# Patient Record
Sex: Female | Born: 1955
Health system: Southern US, Community
[De-identification: ages and names within clinical notes are randomized; demographics above are authoritative.]

## PROBLEM LIST (undated history)

## (undated) DIAGNOSIS — E7800 Pure hypercholesterolemia, unspecified: Secondary | ICD-10-CM

## (undated) DIAGNOSIS — K30 Functional dyspepsia: Secondary | ICD-10-CM

## (undated) DIAGNOSIS — N809 Endometriosis, unspecified: Secondary | ICD-10-CM

## (undated) DIAGNOSIS — Z87442 Personal history of urinary calculi: Secondary | ICD-10-CM

## (undated) DIAGNOSIS — J309 Allergic rhinitis, unspecified: Secondary | ICD-10-CM

## (undated) DIAGNOSIS — E042 Nontoxic multinodular goiter: Secondary | ICD-10-CM

## (undated) DIAGNOSIS — I10 Essential (primary) hypertension: Secondary | ICD-10-CM

## (undated) DIAGNOSIS — N201 Calculus of ureter: Secondary | ICD-10-CM

## (undated) DIAGNOSIS — M65332 Trigger finger, left middle finger: Secondary | ICD-10-CM

## (undated) DIAGNOSIS — F419 Anxiety disorder, unspecified: Secondary | ICD-10-CM

## (undated) DIAGNOSIS — R002 Palpitations: Secondary | ICD-10-CM

## (undated) DIAGNOSIS — M722 Plantar fascial fibromatosis: Secondary | ICD-10-CM

## (undated) DIAGNOSIS — Z973 Presence of spectacles and contact lenses: Secondary | ICD-10-CM

## (undated) DIAGNOSIS — C4491 Basal cell carcinoma of skin, unspecified: Secondary | ICD-10-CM

## (undated) DIAGNOSIS — E041 Nontoxic single thyroid nodule: Secondary | ICD-10-CM

## (undated) DIAGNOSIS — M1990 Unspecified osteoarthritis, unspecified site: Secondary | ICD-10-CM

## (undated) HISTORY — PX: BREAST SURGERY: SHX581

## (undated) HISTORY — PX: COLONOSCOPY: SHX174

## (undated) HISTORY — PX: EYE SURGERY: SHX253

## (undated) HISTORY — PX: CHOLECYSTECTOMY: SHX55

## (undated) HISTORY — PX: DIAGNOSTIC LAPAROSCOPY: SUR761

---

## 1999-01-04 ENCOUNTER — Other Ambulatory Visit: Admission: RE | Admit: 1999-01-04 | Discharge: 1999-01-04 | Payer: Self-pay | Admitting: *Deleted

## 2000-05-21 ENCOUNTER — Other Ambulatory Visit: Admission: RE | Admit: 2000-05-21 | Discharge: 2000-05-21 | Payer: Self-pay | Admitting: Obstetrics & Gynecology

## 2001-06-09 ENCOUNTER — Other Ambulatory Visit: Admission: RE | Admit: 2001-06-09 | Discharge: 2001-06-09 | Payer: Self-pay | Admitting: Obstetrics & Gynecology

## 2001-10-08 ENCOUNTER — Other Ambulatory Visit: Admission: RE | Admit: 2001-10-08 | Discharge: 2001-10-08 | Payer: Self-pay | Admitting: Obstetrics & Gynecology

## 2002-07-14 ENCOUNTER — Other Ambulatory Visit: Admission: RE | Admit: 2002-07-14 | Discharge: 2002-07-14 | Payer: Self-pay | Admitting: Obstetrics & Gynecology

## 2003-05-05 ENCOUNTER — Encounter (INDEPENDENT_AMBULATORY_CARE_PROVIDER_SITE_OTHER): Payer: Self-pay | Admitting: Specialist

## 2003-05-05 ENCOUNTER — Observation Stay (HOSPITAL_COMMUNITY): Admission: RE | Admit: 2003-05-05 | Discharge: 2003-05-06 | Payer: Self-pay | Admitting: General Surgery

## 2003-08-29 ENCOUNTER — Other Ambulatory Visit: Admission: RE | Admit: 2003-08-29 | Discharge: 2003-08-29 | Payer: Self-pay

## 2004-09-17 ENCOUNTER — Other Ambulatory Visit: Admission: RE | Admit: 2004-09-17 | Discharge: 2004-09-17 | Payer: Self-pay | Admitting: Family Medicine

## 2005-09-24 ENCOUNTER — Other Ambulatory Visit: Admission: RE | Admit: 2005-09-24 | Discharge: 2005-09-24 | Payer: Self-pay | Admitting: Family Medicine

## 2005-10-30 ENCOUNTER — Encounter: Admission: RE | Admit: 2005-10-30 | Discharge: 2005-10-30 | Payer: Self-pay | Admitting: Family Medicine

## 2005-12-25 ENCOUNTER — Encounter: Admission: RE | Admit: 2005-12-25 | Discharge: 2005-12-25 | Payer: Self-pay | Admitting: Family Medicine

## 2006-01-11 ENCOUNTER — Encounter: Admission: RE | Admit: 2006-01-11 | Discharge: 2006-01-11 | Payer: Self-pay | Admitting: Family Medicine

## 2007-04-02 ENCOUNTER — Encounter: Admission: RE | Admit: 2007-04-02 | Discharge: 2007-04-02 | Payer: Self-pay | Admitting: Family Medicine

## 2007-04-15 ENCOUNTER — Encounter: Admission: RE | Admit: 2007-04-15 | Discharge: 2007-04-15 | Payer: Self-pay | Admitting: Family Medicine

## 2007-04-22 ENCOUNTER — Encounter: Admission: RE | Admit: 2007-04-22 | Discharge: 2007-04-22 | Payer: Self-pay | Admitting: Family Medicine

## 2008-11-08 ENCOUNTER — Other Ambulatory Visit: Admission: RE | Admit: 2008-11-08 | Discharge: 2008-11-08 | Payer: Self-pay | Admitting: Family Medicine

## 2010-05-09 ENCOUNTER — Encounter: Admission: RE | Admit: 2010-05-09 | Discharge: 2010-05-09 | Payer: Self-pay | Admitting: Family Medicine

## 2011-03-08 NOTE — Op Note (Signed)
NAMEANYELY, CUNNING                           ACCOUNT NO.:  [id number]   MEDICAL RECORD NO.:  [id number]                   PATIENT TYPE:  AMB   LOCATION:  DAY                                  FACILITY:  [facility]   PHYSICIAN:  Timothy E. Earlene Plater, M.D.              DATE OF BIRTH:  1956-01-19   DATE OF PROCEDURE:  05/05/2003  DATE OF DISCHARGE:                                 OPERATIVE REPORT   PREOPERATIVE DIAGNOSIS:  Chronic cholecystolithiasis.   POSTOPERATIVE DIAGNOSIS:  Chronic cholecystolithiasis.   OPERATION/PROCEDURE:  Laparoscopic cholecystectomy.   SURGEON:  Timothy E. Earlene Plater, M.D.   ASSISTANT:  Currie Paris, M.D.   ANESTHESIA:  General.   INDICATIONS:  Mrs. Diffee presents with symptomatic chronic  cholecystolithiasis, normal liver function studies and otherwise good past  history.  She is identified and the permit signed and evaluated by  anesthesia.   DESCRIPTION OF PROCEDURE:  She is taken to the operating room and placed  supine. General endotracheal anesthesia administered.  Her abdomen is  prepped and draped in the usual fashion.  Marcaine 0.25% with epinephrine  was used prior to each incision.  Horizontal infraumbilical incision made  through an old scar, fascia identified and opened vertically, peritoneum  entered without complications.  A #1 Vicryl placed and then the Hasson  catheter threaded through the suture and tied in placed.  The abdomen was  insufflated.  General peritoneoscopy was unremarkable except for some filmy  adhesions of the cecum to the lateral abdominal wall.  The gallbladder  appeared chronically inflamed.  A 10 mm trocar was placed in the mid  epigastrium.  Two 5 mm trocars in the mid epigastrium.  The gallbladder was  grasped, placed on tension.  Its overall anatomy was good showing a partial  mesentery, a long cystic duct and a visible cystic artery.  A huge window  was made posterior behind the gallbladder involving half of the  body of the  gallbladder.  The cystic duct was dissected out free.  The cystic artery  dissected out free and we then quadruply clipped each duct of the vessel,  divided those and the gallbladder was removed from the gallbladder bed  without incident or complication.  The gallbladder bed was dry.  The  gallbladder was removed through the infundibulum incision which was closed  under direct vision.  Irrigation carried out and  was completely clear of all irrigant and CO2.  Trocars were removed under  direct vision.  Counts correct.  Skin incisions inspected and then closed  with subcuticular sutures of 3-0 Monocryl.  Steri-Strips applied.  Final  counts correct.  She tolerated it well, was awakened and taken to the  recovery room in good condition.  Timothy E. Earlene Plater, M.D.    TED/MEDQ  D:  05/05/2003  T:  05/05/2003  Job:  [id number]   cc:   Talmadge Coventry, M.D.  [address], [address]  [place]  [place] [address]  Fax: [phone]

## 2011-11-21 ENCOUNTER — Other Ambulatory Visit (HOSPITAL_COMMUNITY)
Admission: RE | Admit: 2011-11-21 | Discharge: 2011-11-21 | Disposition: A | Discharge status: Home / Self Care | Payer: BC Managed Care – PPO | Source: Ambulatory Visit | Attending: Family Medicine | Admitting: Family Medicine

## 2011-11-21 DIAGNOSIS — Z0000 Encounter for general adult medical examination without abnormal findings: Secondary | ICD-10-CM | POA: Insufficient documentation

## 2011-11-22 ENCOUNTER — Other Ambulatory Visit: Payer: Self-pay | Admitting: Family Medicine

## 2012-05-04 ENCOUNTER — Other Ambulatory Visit: Payer: Self-pay | Admitting: Family Medicine

## 2012-05-04 DIAGNOSIS — E049 Nontoxic goiter, unspecified: Secondary | ICD-10-CM

## 2012-05-07 ENCOUNTER — Ambulatory Visit
Admission: RE | Admit: 2012-05-07 | Discharge: 2012-05-07 | Disposition: A | Discharge status: Home / Self Care | Payer: BC Managed Care – PPO | Source: Ambulatory Visit | Attending: Family Medicine | Admitting: Family Medicine

## 2012-05-07 DIAGNOSIS — E049 Nontoxic goiter, unspecified: Secondary | ICD-10-CM

## 2014-02-03 ENCOUNTER — Other Ambulatory Visit (HOSPITAL_COMMUNITY)
Admission: RE | Admit: 2014-02-03 | Discharge: 2014-02-03 | Disposition: A | Discharge status: Home / Self Care | Payer: BC Managed Care – PPO | Source: Ambulatory Visit | Attending: Family Medicine | Admitting: Family Medicine

## 2014-02-03 ENCOUNTER — Other Ambulatory Visit: Payer: Self-pay | Admitting: Family Medicine

## 2014-02-03 DIAGNOSIS — Z0000 Encounter for general adult medical examination without abnormal findings: Secondary | ICD-10-CM | POA: Insufficient documentation

## 2016-05-02 ENCOUNTER — Other Ambulatory Visit (HOSPITAL_COMMUNITY)
Admission: RE | Admit: 2016-05-02 | Discharge: 2016-05-02 | Disposition: A | Discharge status: Home / Self Care | Payer: BLUE CROSS/BLUE SHIELD | Source: Ambulatory Visit | Attending: Family Medicine | Admitting: Family Medicine

## 2016-05-02 ENCOUNTER — Other Ambulatory Visit: Payer: Self-pay | Admitting: Family Medicine

## 2016-05-02 DIAGNOSIS — Z124 Encounter for screening for malignant neoplasm of cervix: Secondary | ICD-10-CM | POA: Diagnosis not present

## 2016-05-02 DIAGNOSIS — E042 Nontoxic multinodular goiter: Secondary | ICD-10-CM

## 2016-05-03 LAB — CYTOLOGY - PAP

## 2016-05-09 ENCOUNTER — Ambulatory Visit
Admission: RE | Admit: 2016-05-09 | Discharge: 2016-05-09 | Disposition: A | Discharge status: Home / Self Care | Payer: BLUE CROSS/BLUE SHIELD | Source: Ambulatory Visit | Attending: Family Medicine | Admitting: Family Medicine

## 2016-05-09 DIAGNOSIS — E042 Nontoxic multinodular goiter: Secondary | ICD-10-CM

## 2018-02-17 ENCOUNTER — Other Ambulatory Visit: Payer: Self-pay | Admitting: Physician Assistant

## 2018-02-17 ENCOUNTER — Ambulatory Visit
Admission: RE | Admit: 2018-02-17 | Discharge: 2018-02-17 | Disposition: A | Discharge status: Home / Self Care | Payer: BLUE CROSS/BLUE SHIELD | Source: Ambulatory Visit | Attending: Physician Assistant | Admitting: Physician Assistant

## 2018-02-17 DIAGNOSIS — R319 Hematuria, unspecified: Secondary | ICD-10-CM

## 2018-02-17 DIAGNOSIS — R109 Unspecified abdominal pain: Secondary | ICD-10-CM

## 2018-02-17 DIAGNOSIS — R10A2 Flank pain, left side: Secondary | ICD-10-CM

## 2018-02-24 ENCOUNTER — Other Ambulatory Visit: Payer: Self-pay | Admitting: Urology

## 2018-02-24 ENCOUNTER — Encounter (HOSPITAL_BASED_OUTPATIENT_CLINIC_OR_DEPARTMENT_OTHER): Payer: Self-pay

## 2018-02-25 ENCOUNTER — Other Ambulatory Visit: Payer: Self-pay

## 2018-02-25 ENCOUNTER — Encounter (HOSPITAL_BASED_OUTPATIENT_CLINIC_OR_DEPARTMENT_OTHER): Payer: Self-pay

## 2018-02-25 NOTE — Progress Notes (Signed)
Spoke with:  "Cindy" NPO:  After Midnight, no gum, candy, or mints   Arrival time:  0630AM Labs:  Istat8, EKG AM medications:  Escitalopram, Xanax if needed, Nasal spray Pre op orders:  Yes Ride home:  Fritz Pickerel (husband) [phone]

## 2018-02-25 NOTE — Progress Notes (Signed)
Spoke with:  "Cindy" NPO:  After Midnight, no gum, candy, or mints   Arrival time:  0630AM Labs/Procedures:  KUB, EKG, Istat8 AM medications:  Escitalopram, Xanax if needed, Nasal spray Pre op orders:  Yes Ride home:  Fritz Pickerel (husband) [phone]

## 2018-02-26 NOTE — H&P (Signed)
HPI: Theresa Rice is a 62 year-old female with a left ureteral calculus.  The patient's stone was on her left side. She first noticed the symptoms 02/17/2018. This is her first kidney stone. There is not a history of calculus disease in the family. She is not currently having flank pain, back pain, groin pain, nausea, vomiting, fever or chills. She does not have a burning sensation when she urinates. She has not caught a stone in her urine strainer since her symptoms began.   She has had Ureteral Stent for treatment of her stones in the past.   02/20/18: On 02/17/18 she experienced gross hematuria. She said she has since had some slight discomfort in her left flank but is not having any pain at this time. There has been no change in her voiding pattern. She had a stone 21 years ago based on hydronephrosis on an ultrasound when she was pregnant and underwent stent placement but had to have no further surgical intervention and has not had any further difficulty with stones since that time.   A CT scan on 02/17/18 revealed what is described by the radiologist accurately as an 8 mm long left UPJ stone however its width is actually 5 mm. Small, peripherally located left renal calculi were also noted but no right renal calculi present. It has Hounsfield units of 550.     ALLERGIES: atorvastatin Naproxen Sodium TBCR    MEDICATIONS: None   GU PSH: Endometrial Ablation    NON-GU PSH: Breast lumpectomy Cholecystectomy (laparoscopic)    GU PMH: None   NON-GU PMH: None   FAMILY HISTORY: Heart Disease - Father High Cholesterol - Mother Hypertension - Father, Mother Mother deceased at age 74 from alzheimers - Mother   SOCIAL HISTORY: Marital Status: Married Preferred Language: English; Ethnicity: Not Hispanic Or Latino; Race: White Current Smoking Status: Patient has never smoked.   Tobacco Use Assessment Completed: Used Tobacco in last 30 days?    REVIEW OF SYSTEMS:    GU Review Female:    Patient denies frequent urination, hard to postpone urination, burning /pain with urination, get up at night to urinate, leakage of urine, stream starts and stops, trouble starting your stream, have to strain to urinate, and being pregnant.  Gastrointestinal (Upper):   Patient denies nausea, vomiting, and indigestion/ heartburn.  Gastrointestinal (Lower):   Patient denies diarrhea and constipation.  Constitutional:   Patient denies fever, night sweats, weight loss, and fatigue.  Skin:   Patient denies skin rash/ lesion and itching.  Eyes:   Patient denies blurred vision and double vision.  Ears/ Nose/ Throat:   Patient denies sore throat and sinus problems.  Hematologic/Lymphatic:   Patient denies swollen glands and easy bruising.  Cardiovascular:   Patient denies leg swelling and chest pains.  Respiratory:   Patient denies cough and shortness of breath.  Endocrine:   Patient denies excessive thirst.  Musculoskeletal:   Patient denies back pain and joint pain.  Neurological:   Patient denies headaches and dizziness.  Psychologic:   Patient denies depression and anxiety.   VITAL SIGNS:    Weight 180 lb / 81.65 kg  Height 64 in / 162.56 cm  BP 113/73 mmHg  Pulse 80 /min  Temperature 98.0 F / 36.6 C  BMI 30.9 kg/m   MULTI-SYSTEM PHYSICAL EXAMINATION:    Constitutional: Well-nourished. No physical deformities. Normally developed. Good grooming.  Neck: Neck symmetrical, not swollen. Normal tracheal position.  Respiratory: No labored breathing, no use of accessory muscles.  Normal breath sounds.  Cardiovascular: Normal temperature, normal extremity pulses, no swelling, no varicosities.   Lymphatic: No enlargement of neck, axillae, groin.  Skin: No paleness, no jaundice, no cyanosis. No lesion, no ulcer, no rash.  Neurologic / Psychiatric: Oriented to time, oriented to place, oriented to person. No depression, no anxiety, no agitation.  Gastrointestinal: No mass, no tenderness, no rigidity,  non obese abdomen.  Eyes: Normal conjunctivae. Normal eyelids.  Ears, Nose, Mouth, and Throat: Left ear no scars, no lesions, no masses. Right ear no scars, no lesions, no masses. Nose no scars, no lesions, no masses. Normal hearing. Normal lips.  Musculoskeletal: Normal gait and station of head and neck.     PAST DATA REVIEWED:  Source Of History:  Patient  Lab Test Review:   BUN/Creatinine, Calcium  Records Review:   Previous Doctor Records, POC Tool  X-Ray Review: C.T. Stone Protocol: Reviewed Films. Reviewed Report. Discussed With Patient.    Notes:                     Her creatinine in 8/18 was normal at 0.62. At that time she was found to have a serum calcium of 10.2.   PROCEDURES:         KUB - [id number]  A single view of the abdomen is obtained.      Independent review of her KUB reveals I cannot definitely discern the stone in her left ureter. I do see a density but it is right at the tip of the L3 transverse process on the left-hand side and may just be the transverse process itself.         Urinalysis Dipstick Dipstick Cont'd Micro  Color: Yellow Bilirubin: Neg WBC/hpf: 0 - 5/hpf  Appearance: Clear Ketones: Neg RBC/hpf: 40 - 60/hpf  Specific Gravity: 1.025 Blood: 3+ Bacteria: Rare (0-9/hpf)  pH: 6.5 Protein: 1+ Cystals: NS (Not Seen)  Glucose: Neg Urobilinogen: 0.2 Casts: NS (Not Seen)    Nitrites: Neg Trichomonas: Not Present    Leukocyte Esterase: Neg Mucous: Not Present      Epithelial Cells: NS (Not Seen)      Yeast: NS (Not Seen)      Sperm: Not Present  Notes:   We discussed the management of urinary stones. These options include observation, ureteroscopy, shockwave lithotripsy, and PCNL. We discussed which options are relevant to these particular stones. We discussed the natural history of stones as well as the complications of untreated stones and the impact on quality of life without treatment as well as with each of the above listed treatments. We also discussed  the efficacy of each treatment in its ability to clear the stone burden. With any of these management options I discussed the signs and symptoms of infection and the need for emergent treatment should these be experienced. For each option we discussed the ability of each procedure to clear the patient of their stone burden.   For observation I described the risks which include but are not limited to silent renal damage, life-threatening infection, need for emergent surgery, failure to pass stone, and pain.   For ureteroscopy I described the risks which include heart attack, stroke, pulmonary embolus, death, bleeding, infection, damage to contiguous structures, positioning injury, ureteral stricture, ureteral avulsion, ureteral injury, need for ureteral stent, inability to perform ureteroscopy, need for an interval procedure, inability to clear stone burden, stent discomfort and pain.   For shockwave lithotripsy I described the risks which include arrhythmia, kidney contusion,  kidney hemorrhage, need for transfusion, long-term risk of diabetes or hypertension, back discomfort, flank ecchymosis, flank abrasion, inability to break up stone, inability to pass stone fragments, Steinstrasse, infection associated with obstructing stones, need for different surgical procedure, need for repeat shockwave lithotripsy, and death.    ASSESSMENT/PLAN:      ICD-10 Details  1 GU:   Ureteral calculus - N20.1 Left, Acute - Since her stone is not visible on KUB lithotripsy is not an option so we discussed ureteroscopy versus medical expulsive therapy. She has pain medication and is on tamsulosin already. She would like to proceed with ureteroscopy if her stone does not pass spontaneously and therefore that will be scheduled.  2   Renal calculus - N20.0 Left, She has small left renal calculi.

## 2018-02-27 ENCOUNTER — Ambulatory Visit (HOSPITAL_BASED_OUTPATIENT_CLINIC_OR_DEPARTMENT_OTHER): Payer: BLUE CROSS/BLUE SHIELD | Admitting: Anesthesiology

## 2018-02-27 ENCOUNTER — Other Ambulatory Visit: Payer: Self-pay

## 2018-02-27 ENCOUNTER — Ambulatory Visit (HOSPITAL_COMMUNITY): Payer: BLUE CROSS/BLUE SHIELD

## 2018-02-27 ENCOUNTER — Ambulatory Visit (HOSPITAL_BASED_OUTPATIENT_CLINIC_OR_DEPARTMENT_OTHER)
Admission: RE | Admit: 2018-02-27 | Discharge: 2018-02-27 | Disposition: A | Discharge status: Home / Self Care | Payer: BLUE CROSS/BLUE SHIELD | Source: Ambulatory Visit | Attending: Urology | Admitting: Urology

## 2018-02-27 ENCOUNTER — Encounter (HOSPITAL_BASED_OUTPATIENT_CLINIC_OR_DEPARTMENT_OTHER)
Admission: RE | Disposition: A | Discharge status: Home / Self Care | Payer: Self-pay | Source: Ambulatory Visit | Attending: Urology

## 2018-02-27 ENCOUNTER — Encounter (HOSPITAL_BASED_OUTPATIENT_CLINIC_OR_DEPARTMENT_OTHER): Payer: Self-pay | Admitting: *Deleted

## 2018-02-27 DIAGNOSIS — N135 Crossing vessel and stricture of ureter without hydronephrosis: Secondary | ICD-10-CM | POA: Diagnosis not present

## 2018-02-27 DIAGNOSIS — Z79899 Other long term (current) drug therapy: Secondary | ICD-10-CM | POA: Diagnosis not present

## 2018-02-27 DIAGNOSIS — N201 Calculus of ureter: Secondary | ICD-10-CM | POA: Insufficient documentation

## 2018-02-27 DIAGNOSIS — F419 Anxiety disorder, unspecified: Secondary | ICD-10-CM | POA: Diagnosis not present

## 2018-02-27 HISTORY — DX: Nontoxic single thyroid nodule: E04.1

## 2018-02-27 HISTORY — DX: Allergic rhinitis, unspecified: J30.9

## 2018-02-27 HISTORY — DX: Basal cell carcinoma of skin, unspecified: C44.91

## 2018-02-27 HISTORY — DX: Nontoxic multinodular goiter: E04.2

## 2018-02-27 HISTORY — DX: Palpitations: R00.2

## 2018-02-27 HISTORY — DX: Pure hypercholesterolemia, unspecified: E78.00

## 2018-02-27 HISTORY — DX: Endometriosis, unspecified: N80.9

## 2018-02-27 HISTORY — DX: Plantar fascial fibromatosis: M72.2

## 2018-02-27 HISTORY — DX: Anxiety disorder, unspecified: F41.9

## 2018-02-27 HISTORY — DX: Functional dyspepsia: K30

## 2018-02-27 HISTORY — DX: Calculus of ureter: N20.1

## 2018-02-27 HISTORY — DX: Trigger finger, left middle finger: M65.332

## 2018-02-27 HISTORY — DX: Essential (primary) hypertension: I10

## 2018-02-27 HISTORY — PX: URETEROSCOPY WITH HOLMIUM LASER LITHOTRIPSY: SHX6645

## 2018-02-27 HISTORY — DX: Unspecified osteoarthritis, unspecified site: M19.90

## 2018-02-27 LAB — POCT I-STAT, CHEM 8
BUN: 14 mg/dL (ref 6–20)
Calcium, Ion: 1.2 mmol/L (ref 1.15–1.40)
Chloride: 98 mmol/L — ABNORMAL LOW (ref 101–111)
Creatinine, Ser: 0.5 mg/dL (ref 0.44–1.00)
Glucose, Bld: 104 mg/dL — ABNORMAL HIGH (ref 65–99)
HCT: 36 % (ref 36.0–46.0)
Hemoglobin: 12.2 g/dL (ref 12.0–15.0)
Potassium: 3.3 mmol/L — ABNORMAL LOW (ref 3.5–5.1)
Sodium: 140 mmol/L (ref 135–145)
TCO2: 28 mmol/L (ref 22–32)

## 2018-02-27 SURGERY — URETEROSCOPY, WITH LITHOTRIPSY USING HOLMIUM LASER
Anesthesia: General | Laterality: Left

## 2018-02-27 MED ORDER — ACETAMINOPHEN 160 MG/5ML PO SOLN
325.0000 mg | ORAL | Status: DC | PRN
  Filled 2018-02-27: qty 20.3

## 2018-02-27 MED ORDER — LIDOCAINE 2% (20 MG/ML) 5 ML SYRINGE
INTRAMUSCULAR | Status: DC | PRN
  Administered 2018-02-27: 60 mg via INTRAVENOUS

## 2018-02-27 MED ORDER — ONDANSETRON HCL 4 MG/2ML IJ SOLN
4.0000 mg | Freq: Once | INTRAMUSCULAR | Status: AC | PRN
  Administered 2018-02-27: 4 mg via INTRAVENOUS
  Filled 2018-02-27: qty 2

## 2018-02-27 MED ORDER — SODIUM CHLORIDE 0.9 % IR SOLN
Status: DC | PRN
  Administered 2018-02-27: 3000 mL via INTRAVESICAL

## 2018-02-27 MED ORDER — OXYCODONE HCL 5 MG PO TABS
ORAL_TABLET | ORAL | Status: AC
  Filled 2018-02-27: qty 1

## 2018-02-27 MED ORDER — ACETAMINOPHEN 325 MG PO TABS
325.0000 mg | ORAL_TABLET | ORAL | Status: DC | PRN
  Filled 2018-02-27: qty 2

## 2018-02-27 MED ORDER — OXYCODONE HCL 5 MG/5ML PO SOLN
5.0000 mg | Freq: Once | ORAL | Status: AC | PRN
  Filled 2018-02-27: qty 5

## 2018-02-27 MED ORDER — FENTANYL CITRATE (PF) 100 MCG/2ML IJ SOLN
INTRAMUSCULAR | Status: AC
  Filled 2018-02-27: qty 2

## 2018-02-27 MED ORDER — FENTANYL CITRATE (PF) 100 MCG/2ML IJ SOLN
INTRAMUSCULAR | Status: DC | PRN
  Administered 2018-02-27: 25 ug via INTRAVENOUS
  Administered 2018-02-27: 50 ug via INTRAVENOUS

## 2018-02-27 MED ORDER — DEXAMETHASONE SODIUM PHOSPHATE 10 MG/ML IJ SOLN
INTRAMUSCULAR | Status: DC | PRN
  Administered 2018-02-27: 10 mg via INTRAVENOUS

## 2018-02-27 MED ORDER — OXYCODONE HCL 5 MG PO TABS
5.0000 mg | ORAL_TABLET | Freq: Once | ORAL | Status: AC | PRN
  Administered 2018-02-27: 5 mg via ORAL
  Filled 2018-02-27: qty 1

## 2018-02-27 MED ORDER — HYDROCODONE-ACETAMINOPHEN 10-325 MG PO TABS: 1.0000 | ORAL_TABLET | ORAL | 0 refills | Status: DC | PRN

## 2018-02-27 MED ORDER — IOHEXOL 300 MG/ML  SOLN
INTRAMUSCULAR | Status: DC | PRN
  Administered 2018-02-27: 18 mL via INTRAVENOUS

## 2018-02-27 MED ORDER — ONDANSETRON HCL 4 MG/2ML IJ SOLN
INTRAMUSCULAR | Status: DC | PRN
  Administered 2018-02-27: 4 mg via INTRAVENOUS

## 2018-02-27 MED ORDER — PHENAZOPYRIDINE HCL 200 MG PO TABS
200.0000 mg | ORAL_TABLET | Freq: Once | ORAL | Status: AC
  Administered 2018-02-27: 200 mg via ORAL
  Filled 2018-02-27: qty 1

## 2018-02-27 MED ORDER — CIPROFLOXACIN IN D5W 400 MG/200ML IV SOLN
400.0000 mg | Freq: Once | INTRAVENOUS | Status: AC
  Administered 2018-02-27: 400 mg via INTRAVENOUS
  Filled 2018-02-27: qty 200

## 2018-02-27 MED ORDER — MEPERIDINE HCL 25 MG/ML IJ SOLN
6.2500 mg | INTRAMUSCULAR | Status: DC | PRN
  Filled 2018-02-27: qty 1

## 2018-02-27 MED ORDER — CIPROFLOXACIN IN D5W 400 MG/200ML IV SOLN
INTRAVENOUS | Status: AC
Start: 2018-02-27 — End: ?
  Filled 2018-02-27: qty 200

## 2018-02-27 MED ORDER — DEXAMETHASONE SODIUM PHOSPHATE 10 MG/ML IJ SOLN
INTRAMUSCULAR | Status: AC
  Filled 2018-02-27: qty 1

## 2018-02-27 MED ORDER — PHENAZOPYRIDINE HCL 200 MG PO TABS: 200.0000 mg | ORAL_TABLET | Freq: Three times a day (TID) | ORAL | 0 refills | Status: DC | PRN

## 2018-02-27 MED ORDER — MIDAZOLAM HCL 2 MG/2ML IJ SOLN
INTRAMUSCULAR | Status: AC
  Filled 2018-02-27: qty 2

## 2018-02-27 MED ORDER — PHENAZOPYRIDINE HCL 100 MG PO TABS
ORAL_TABLET | ORAL | Status: AC
  Filled 2018-02-27: qty 2

## 2018-02-27 MED ORDER — PROPOFOL 10 MG/ML IV BOLUS
INTRAVENOUS | Status: DC | PRN
  Administered 2018-02-27: 160 mg via INTRAVENOUS

## 2018-02-27 MED ORDER — ONDANSETRON HCL 4 MG/2ML IJ SOLN
INTRAMUSCULAR | Status: AC
  Filled 2018-02-27: qty 2

## 2018-02-27 MED ORDER — FENTANYL CITRATE (PF) 100 MCG/2ML IJ SOLN
25.0000 ug | INTRAMUSCULAR | Status: DC | PRN
  Administered 2018-02-27 (×2): 25 ug via INTRAVENOUS
  Administered 2018-02-27: 50 ug via INTRAVENOUS
  Filled 2018-02-27: qty 1

## 2018-02-27 MED ORDER — MIDAZOLAM HCL 2 MG/2ML IJ SOLN
INTRAMUSCULAR | Status: DC | PRN
  Administered 2018-02-27: 2 mg via INTRAVENOUS

## 2018-02-27 MED ORDER — PROPOFOL 10 MG/ML IV BOLUS
INTRAVENOUS | Status: AC
  Filled 2018-02-27: qty 20

## 2018-02-27 MED ORDER — LIDOCAINE 2% (20 MG/ML) 5 ML SYRINGE
INTRAMUSCULAR | Status: AC
  Filled 2018-02-27: qty 5

## 2018-02-27 MED ORDER — LACTATED RINGERS IV SOLN
INTRAVENOUS | Status: DC
  Administered 2018-02-27 (×2): via INTRAVENOUS
  Filled 2018-02-27: qty 1000

## 2018-02-27 SURGICAL SUPPLY — 30 items
BAG DRAIN URO-CYSTO SKYTR STRL (DRAIN) ×2 IMPLANT
BAG DRN UROCATH (DRAIN) ×1
BALLN NEPHROSTOMY (BALLOONS) ×2
BALLOON NEPHROSTOMY (BALLOONS) IMPLANT
BASKET ZERO TIP NITINOL 2.4FR (BASKET) ×1 IMPLANT
BSKT STON RTRVL ZERO TP 2.4FR (BASKET) ×1
CATH INTERMIT  6FR 70CM (CATHETERS) ×1 IMPLANT
CATH URET 5FR 28IN CONE TIP (BALLOONS)
CATH URET 5FR 70CM CONE TIP (BALLOONS) IMPLANT
CATH URET DUAL LUMEN 6-10FR 50 (CATHETERS) ×1 IMPLANT
CLOTH BEACON ORANGE TIMEOUT ST (SAFETY) ×2 IMPLANT
EXTRACTOR STONE 1.7FRX115CM (UROLOGICAL SUPPLIES) IMPLANT
FIBER LASER FLEXIVA 365 (UROLOGICAL SUPPLIES) IMPLANT
FIBER LASER TRAC TIP (UROLOGICAL SUPPLIES) IMPLANT
GLOVE BIO SURGEON STRL SZ8 (GLOVE) ×2 IMPLANT
GOWN STRL REUS W/TWL XL LVL3 (GOWN DISPOSABLE) ×2 IMPLANT
GUIDEWIRE 0.038 PTFE COATED (WIRE) IMPLANT
GUIDEWIRE ANG ZIPWIRE 038X150 (WIRE) IMPLANT
GUIDEWIRE STR DUAL SENSOR (WIRE) ×3 IMPLANT
INFUSOR MANOMETER BAG 3000ML (MISCELLANEOUS) ×2 IMPLANT
IV NS IRRIG 3000ML ARTHROMATIC (IV SOLUTION) ×4 IMPLANT
KIT TURNOVER CYSTO (KITS) ×2 IMPLANT
MANIFOLD NEPTUNE II (INSTRUMENTS) IMPLANT
NS IRRIG 500ML POUR BTL (IV SOLUTION) ×2 IMPLANT
PACK CYSTO (CUSTOM PROCEDURE TRAY) ×2 IMPLANT
SHEATH URETERAL 12FRX35CM (MISCELLANEOUS) ×1 IMPLANT
STENT URET 6FRX24 CONTOUR (STENTS) ×1 IMPLANT
TUBE CONNECTING 12X1/4 (SUCTIONS) IMPLANT
TUBING UROLOGY SET (TUBING) ×1 IMPLANT
WATER STERILE IRR 3000ML UROMA (IV SOLUTION) IMPLANT

## 2018-02-27 NOTE — Op Note (Signed)
PATIENT:  Theresa Rice  PRE-OPERATIVE DIAGNOSIS:  left Ureteral calculus  POST-OPERATIVE DIAGNOSIS: 1.  Left ureteral calculus. 2.  Mid ureteral stricture  PROCEDURE:  1. Cystoscopy with left retrograde pyelogram including interpretation. 2. Balloon dilatation of mid urethral stricture. 3. Left ureteroscopy and stone extraction. 4. Left double-J stent placement  5. Fluoroscopy time less than 1 hour  SURGEON: Claybon Jabs, MD  INDICATION: Theresa Rice is a 62 year old female with a known left proximal ureteral stone.  The stone was noted to be 8 mm long and 5 mm wide.  It was noted on her preoperative KUB to remain in the proximal ureter at about the level of the UPJ and therefore she has elected to proceed with ureteroscopic management.  ANESTHESIA:  General  EBL:  Minimal  DRAINS: 6 French, 24 cm double-J stent (no string)  SPECIMEN: Stone given the patient  DESCRIPTION OF PROCEDURE: The patient was taken to the major OR and placed on the table. General anesthesia was administered and then the patient was moved to the dorsal lithotomy position. The genitalia was sterilely prepped and draped. An official timeout was performed.  Initially the 88 French cystoscope with 30 lens was passed under direct vision into the bladder. The bladder was then fully inspected. It was noted be free of any tumors, stones or inflammatory lesions. Ureteral orifices were of normal configuration and position. A 6 French open-ended ureteral catheter was then passed through the cystoscope into the ureteral orifice in order to perform a left retrograde pyelogram.  A retrograde pyelogram was performed by injecting full-strength contrast up the left ureter under direct fluoroscopic control. It revealed a filling defect in the proximal ureter consistent with the stone seen on the preoperative KUB. The remainder of the ureter was noted to be normal as was the intrarenal collecting system. I then passed a 0.038  inch floppy-tipped guidewire through the open ended catheter and into the area of the renal pelvis and this was left in place. The inner portion of a ureteral access sheath was then passed over the guidewire to gently dilate the intramural ureter and as I advance this up the I noted some resistance at the mid sacral region.  I therefore removed the inner portion of the access sheath and placed a dual-lumen ureteral catheter over the guidewire and placed a second guidewire both positioned now in the renal pelvis.  The first guidewire was secured to the drape as a safety guidewire and the second used as a working guidewire.    A 6 French flexible ureteroscope was passed over the working guidewire into the up to the level where the stone was identified.  It was no longer present there and appeared to have migrated back into the kidney so I advanced the scope into the kidney over the guidewire and then remove the guidewire.  Systematic inspection revealed the stone was located in the lower pole and then flushed into the renal pelvic region.  There were a few blood clots and visualization was somewhat difficult therefore I elected to proceed with placement of a ureteral access sheath in order to maintain low pressure in the system and better flow for improved visualization.  The guidewire was then passed through the ureteroscope and left in place and the ureteroscope was removed.  I passed the inner portion of the ureteral access sheath over the guidewire once again with some slight resistance at the mid sacral region and then remove this and assembled the access sheath  and passed over the guidewire but was unable to advance it beyond this location.  Rather than continue to try to pass the access sheath I elected to remove this and passed a ureteral dilating balloon over the guidewire and gently inflated this to 3 atm with some wasting noted at the mid sacral region initially but this then opened up nicely.  I left  the balloon inflated for approximately 60 seconds and then deflated the balloon and removed it.  After performing this I was able to pass the access sheath with no resistance over the working guidewire into the area the renal pelvis.  The working guidewire was removed and the ureteroscope reinserted.  With better visualization I was now able to identify the stone.  It was engaged in a 0 tip nitinol basket along its long axis and then I was able to advance this down through the access sheath and out without difficulty.  I therefore removed the access sheath.   I then backloaded the cystoscope over the safety guidewire and passed the stent over the guidewire into the area of the renal pelvis. As the guidewire was removed good curl was noted in the renal pelvis. The bladder was drained and the cystoscope was then removed. The patient tolerated the procedure well no intraoperative complications.  PLAN OF CARE: Discharge to home after PACU  PATIENT DISPOSITION:  PACU - hemodynamically stable.

## 2018-02-27 NOTE — Anesthesia Procedure Notes (Signed)
Procedure Name: LMA Insertion Date/Time: 02/27/2018 7:43 AM Performed by: Suan Halter, CRNA Pre-anesthesia Checklist: Patient identified, Emergency Drugs available, Suction available and Patient being monitored Patient Re-evaluated:Patient Re-evaluated prior to induction Oxygen Delivery Method: Circle system utilized Preoxygenation: Pre-oxygenation with 100% oxygen Induction Type: IV induction Ventilation: Mask ventilation without difficulty LMA: LMA inserted LMA Size: 4.0 Number of attempts: 1 Airway Equipment and Method: Bite block Placement Confirmation: positive ETCO2 Tube secured with: Tape Dental Injury: Teeth and Oropharynx as per pre-operative assessment

## 2018-02-27 NOTE — Anesthesia Preprocedure Evaluation (Signed)
Anesthesia Evaluation  Patient identified by MRN, date of birth, ID band Patient awake    Reviewed: Allergy & Precautions, NPO status , Patient's Chart, lab work & pertinent test results  Airway Mallampati: I       Dental no notable dental hx. (+) Teeth Intact   Pulmonary neg pulmonary ROS,    Pulmonary exam normal breath sounds clear to auscultation       Cardiovascular hypertension, Pt. on home beta blockers Normal cardiovascular exam Rhythm:Regular Rate:Normal     Neuro/Psych PSYCHIATRIC DISORDERS Anxiety negative neurological ROS     GI/Hepatic negative GI ROS, Neg liver ROS,   Endo/Other  negative endocrine ROS  Renal/GU negative Renal ROS  negative genitourinary   Musculoskeletal   Abdominal Normal abdominal exam  (+)   Peds  Hematology negative hematology ROS (+)   Anesthesia Other Findings   Reproductive/Obstetrics                             Anesthesia Physical Anesthesia Plan  ASA: II  Anesthesia Plan: General   Post-op Pain Management:    Induction: Intravenous  PONV Risk Score and Plan: 4 or greater and Ondansetron and Dexamethasone  Airway Management Planned: LMA  Additional Equipment:   Intra-op Plan:   Post-operative Plan:   Informed Consent: I have reviewed the patients History and Physical, chart, labs and discussed the procedure including the risks, benefits and alternatives for the proposed anesthesia with the patient or authorized representative who has indicated his/her understanding and acceptance.   Dental advisory given  Plan Discussed with: CRNA and Surgeon  Anesthesia Plan Comments:         Anesthesia Quick Evaluation

## 2018-02-27 NOTE — Anesthesia Postprocedure Evaluation (Signed)
Anesthesia Post Note  Patient: Theresa Rice  Procedure(s) Performed: URETEROSCOPY WITH HOLMIUM LASER LITHOTRIPSY/ STENT PLACEMENT (Left )     Patient location during evaluation: PACU Anesthesia Type: General Level of consciousness: awake Pain management: pain level controlled Vital Signs Assessment: post-procedure vital signs reviewed and stable Respiratory status: spontaneous breathing Cardiovascular status: stable Postop Assessment: no apparent nausea or vomiting Anesthetic complications: no    Last Vitals:  Vitals:   02/27/18 0845 02/27/18 0900  BP: (!) 143/91 (!) 130/95  Pulse: 78 74  Resp: 16 14  Temp:    SpO2: 100% 94%    Last Pain:  Vitals:   02/27/18 0915  TempSrc:   PainSc: 5    Pain Goal: Patients Stated Pain Goal: 0 (02/27/18 0721)               Shawnita Krizek JR,JOHN Mateo Flow

## 2018-02-27 NOTE — Discharge Instructions (Signed)
Post Bladder Surgery Instructions   General instructions:     Your recent bladder surgery requires very little post hospital care but some definite precautions.  Despite the fact that no skin incisions were used, the area around the bladder incisions are raw and covered with scabs to promote healing and prevent bleeding. Certain precautions are needed to insure that the scabs are not disturbed over the next 2-4 weeks while the healing proceeds.  Because the raw surface inside your bladder and the irritating effects of urine you may expect frequency of urination and/or urgency (a stronger desire to urinate) and perhaps even getting up at night more often. This will usually resolve or improve slowly over the healing period. You may see some blood in your urine over the first 6 weeks. Do not be alarmed, even if the urine was clear for a while. Get off your feet and drink lots of fluids until clearing occurs. If you start to pass clots or don't improve call us.  Catheter: (If you are discharged with a catheter.)  1. Keep your catheter secured to your leg at all times with tape or the supplied strap. 2. You may experience leakage of urine around your catheter- as long as the  catheter continues to drain, this is normal.  If your catheter stops draining  go to the ER. 3. You may also have blood in your urine, even after it has been clear for  several days; you may even pass some small blood clots or other material.  This  is normal as well.  If this happens, sit down and drink plenty of water to help  make urine to flush out your bladder.  If the blood in your urine becomes worse  after doing this, contact our office or return to the ER. 4. You may use the leg bag (small bag) during the day, but use the large bag at  night.  Diet:  You may return to your normal diet immediately. Because of the raw surface of your bladder, alcohol, spicy foods, foods high in acid and drinks with caffeine may  cause irritation or frequency and should be used in moderation. To keep your urine flowing freely and avoid constipation, drink plenty of fluids during the day (8-10 glasses). Tip: Avoid cranberry juice because it is very acidic.  Activity:  Your physical activity doesn't need to be restricted. However, if you are very active, you may see some blood in the urine. We suggest that you reduce your activity under the circumstances until the bleeding has stopped.  Bowels:  It is important to keep your bowels regular during the postoperative period. Straining with bowel movements can cause bleeding. A bowel movement every other day is reasonable. Use a mild laxative if needed, such as milk of magnesia 2-3 tablespoons, or 2 Dulcolax tablets. Call if you continue to have problems. If you had been taking narcotics for pain, before, during or after your surgery, you may be constipated. Take a laxative if necessary.    Medication:  You should resume your pre-surgery medications unless told not to. In addition you may be given an antibiotic to prevent or treat infection. Antibiotics are not always necessary. All medication should be taken as prescribed until the bottles are finished unless you are having an unusual reaction to one of the drugs.   [facility] [phone] Post Ureteroscopy With or Without Stent Instructions  Definitions:  Ureter: The duct that transports urine from the kidney to the  bladder. Stent:   A plastic hollow tube that is placed into the ureter, from the kidney to the bladder to prevent the ureter from swelling shut.  GENERAL INSTRUCTIONS:  Despite the fact that no skin incisions were used, the area around the ureter and bladder is raw and irritated. The stent is a foreign body which will further irritate the bladder wall. This irritation is manifested by increased frequency of urination, both day and night, and by an increase in the urge to urinate. In some,  the urge to urinate is present almost always. Sometimes the urge is strong enough that you may not be able to stop yourself from urinating. The only real cure is to remove the stent and then give time for the bladder wall to heal which can't be done until the danger of the ureter swelling shut has passed, which varies.  You may see some blood in your urine while the stent is in place and a few days afterwards. Do not be alarmed, even if the urine was clear for a while. Get off your feet and drink lots of fluids until clearing occurs. If you start to pass clots or don't improve, call us.  DIET: You may return to your normal diet immediately. Because of the raw surface of your bladder, alcohol, spicy foods, acid type foods and drinks with caffeine may cause irritation or frequency and should be used in moderation. To keep your urine flowing freely and to avoid constipation, drink plenty of fluids during the day ( 8-10 glasses ). Tip: Avoid cranberry juice because it is very acidic.  ACTIVITY: Your physical activity doesn't need to be restricted. However, if you are very active, you may see some blood in your urine. We suggest that you reduce your activity under these circumstances until the bleeding has stopped.  BOWELS: It is important to keep your bowels regular during the postoperative period. Straining with bowel movements can cause bleeding. A bowel movement every other day is reasonable. Use a mild laxative if needed, such as Milk of Magnesia 2-3 tablespoons, or 2 Dulcolax tablets. Call if you continue to have problems. If you have been taking narcotics for pain, before, during or after your surgery, you may be constipated. Take a laxative if necessary.   MEDICATION: You should resume your pre-surgery medications unless told not to. In addition you will often be given an antibiotic to prevent infection. These should be taken as prescribed until the bottles are finished unless you are having an  unusual reaction to one of the drugs.  PROBLEMS YOU SHOULD REPORT TO [place]:  Fevers over 100.5 Fahrenheit.  Heavy bleeding, or clots ( See above notes about blood in urine ).  Inability to urinate.  Drug reactions ( hives, rash, nausea, vomiting, diarrhea ).  Severe burning or pain with urination that is not improving.  FOLLOW-UP: You will need a follow-up appointment to monitor your progress. Call for this appointment at the number listed above. Usually the first appointment will be about three to fourteen days after your surgery.      Post Anesthesia Home Care Instructions  Activity: Get plenty of rest for the remainder of the day. A responsible individual must stay with you for 24 hours following the procedure.  For the next 24 hours, DO NOT: -Drive a car -Paediatric nurse -Drink alcoholic beverages -Take any medication unless instructed by your physician -Make any legal decisions or sign important papers.  Meals: Start with liquid foods such as gelatin  or soup. Progress to regular foods as tolerated. Avoid greasy, spicy, heavy foods. If nausea and/or vomiting occur, drink only clear liquids until the nausea and/or vomiting subsides. Call your physician if vomiting continues.  Special Instructions/Symptoms: Your throat may feel dry or sore from the anesthesia or the breathing tube placed in your throat during surgery. If this causes discomfort, gargle with warm salt water. The discomfort should disappear within 24 hours.

## 2018-02-27 NOTE — Transfer of Care (Signed)
Immediate Anesthesia Transfer of Care Note  Patient: Theresa Rice  Procedure(s) Performed: Procedure(s) (LRB): URETEROSCOPY WITH HOLMIUM LASER LITHOTRIPSY/ STENT PLACEMENT (Left)  Patient Location: PACU  Anesthesia Type: General  Level of Consciousness: awake, oriented, sedated and patient cooperative  Airway & Oxygen Therapy: Patient Spontanous Breathing and Patient connected to face mask oxygen  Post-op Assessment: Report given to PACU RN and Post -op Vital signs reviewed and stable  Post vital signs: Reviewed and stable  Complications: No apparent anesthesia complications  Last Vitals:  Vitals Value Taken Time  BP    Temp    Pulse    Resp    SpO2      Last Pain:  Vitals:   02/27/18 0721  TempSrc:   PainSc: 0-No pain      Patients Stated Pain Goal: 0 (02/27/18 0721)

## 2018-03-02 ENCOUNTER — Encounter (HOSPITAL_BASED_OUTPATIENT_CLINIC_OR_DEPARTMENT_OTHER): Payer: Self-pay | Admitting: Urology

## 2018-05-07 ENCOUNTER — Other Ambulatory Visit: Payer: Self-pay | Admitting: Urology

## 2018-05-20 ENCOUNTER — Encounter (HOSPITAL_BASED_OUTPATIENT_CLINIC_OR_DEPARTMENT_OTHER): Payer: Self-pay | Admitting: *Deleted

## 2018-05-20 ENCOUNTER — Other Ambulatory Visit: Payer: Self-pay

## 2018-05-20 NOTE — Progress Notes (Addendum)
Spoke with patient via phone for pre op interview. Per MD order pt needs KUB DOS, Also needs ISTAT 8.  NPO after MN can take lexapro AM of surgery with a sip of water. EKG on chart and in epic. Patient to arrive at [facility]. Also informed patient NOT to take lisinopril/HCTZ AM of surgery.

## 2018-05-28 NOTE — H&P (Signed)
HPI: Theresa Rice is a 62 year-old female with a left ureteral stone.  03/04/18: She had temp last pm 101.3 but currently in office afebrile at 97.5. She did have some constipation which has resolved with LOC (Docusate) She has been controlling pain with PRN Hydrocodone and Oxycodone. She has had some gross hematuria.   03/06/18: Patient returns today for follow up and possible post operative ureteral stent removal. She was given IM Rocephin and started on PO Doxycyline at prior OV. Urine culture from 5/15 still pending. She reports difficulties tolerating Doxycyline due to GI upset. Today, she continues to complain of bothersome left flank pain. She states that the pain radiates to her LLQ. This is fairly well controlled with Hydrocodone with last being last night. She continues to complain of intermittent fevers, reported up to 101 last night via oral measurement. She did not take additional Tylenol or Ibuprofen for this. She is currently afebrile in clinic. She complains of mild dysuria and gross hematuria. She denies difficulties voiding. She did have some issues with constipation, but this is somewhat improved with treatment noted above.   03/09/18: She returns today for follow up. Urine culture from 5/15 showed no growth. However, antibiotic therapy was changed to Keflex given her ongoing complaints. Today, she returns for cysto with ureteral stent removal. Overall, she states that symptoms have improved over the weekend. She continues to have intermittent fever, though only low grade. Most recent was 99.8 last night. She did not use any medications for this. She is currently afebrile. She states that left flank pain has also improved, but she continues to complain of some soreness. She also notes continues irritative symptoms: urgency, frequency, dysuria, and gross hematuria. No clots or difficulties voiding. Constipation is improving.   03/20/18: She presents today with complaints of dysuria, urgency,  and frequency that have been ongoing since her stent removal. She has been using some OTC AZO, but she does not feel this is very beneficial. She denies any unilateral flank pain, but does have some suprapubic discomfort. She denies fever, chills, nausea, or vomiting. No issues with constipation. She feels she empties well.   03/25/18: She presents today with continued complaints. She complains of ongoing dysuria, urgency, and frequency. She states that most of her discomfort is urethral. She complains of frequency of every 30 minutes to 1 hour this morning. She notes some gross hematuria, as well. She denies unilateral flank pain or suprapubic pain. No fever, nausea, vomiting, or chills. She has been using Uribel, which she did not find to be very beneficial.   04/21/18: She returns today for follow with RUS. Overall, she states that she has been doing well. She denies any current flank pain or abdominal pain. She states that voiding symptoms have returned today baseline. She denies dysuria, gross hematuria, fevers, chills, nausea, or vomiting.   05/04/18: She returns today for follow up. She underwent follow up C.T imaging on 7/5, which showed the following:   1. 3 mm stone within the mid/distal LEFT ureter (S1-S2 vertebral  body level) causing mild LEFT-sided hydronephrosis.  2. Two additional punctate stones within the LEFT renal pelvis.   Options were reviewed at the time and she was started on MET. Today, she denies passing a stone in the interim. She continues to remain asymptomatic. She denies current flank or abdominal pain. Voiding symptoms remain stable. No current dysuria, gross hematuria, fever, chills, nausea, or vomiting.   The surgery she had done was cystourethroscopy, (L) RPG,  mid urethral stricture dilatation, and placement of double J stent . Her surgery was done 02/27/2018.   She has not had post operative nausea. She has not had vomiting. She has not had post operative fever. She has  not had post operative chills. She does have a good appetite. BOWEL HABITS: her bowels are moving normally.     ALLERGIES: atorvastatin - Other Reaction, muscle pain Naproxen Sodium TBCR - Hives    MEDICATIONS: Crestor  Lisinopril  Oxybutynin Chloride 5 mg tablet 1 tablet PO TID PRN  Astelin  Flonase Allergy Relief  Hydrocodone-Acetaminophen 5 mg-325 mg tablet 1 tablet PO Q 6 H PRN  Hydrocodone-Acetaminophen 1 PO PRN  Lexapro  Oxycodone Hcl 1 PO PRN  Singulair  Xyzal     GU PSH: Cysto Remove Stent FB Sim - 03/09/2018 Cystoscopy Insert Stent, Left - 02/27/2018 Endometrial Ablation Ureteroscopic stone removal, Left - 02/27/2018    NON-GU PSH: Breast lumpectomy Cholecystectomy (laparoscopic)    GU PMH: Dysuria - 03/20/2018 Flank Pain (Worsening, Chronic), Left, May continue with PRN Hydrocodone and Oxycodone. She can use heat for pain relief. Instructed to push po fluids - 03/04/2018 Renal calculus, Left, She has small left renal calculi. - 02/20/2018 Ureteral calculus (Acute), Left, Since her stone is not visible on KUB lithotripsy is not an option so we discussed ureteroscopy versus medical expulsive therapy. She has pain medication and is on tamsulosin already. She would like to proceed with ureteroscopy if her stone does not pass spontaneously and therefore that will be scheduled. - 02/20/2018    NON-GU PMH: Fever, unspecified (Acute), Culture urine. Ceftriaxone 1 GM IM today. Begin Doxycycline 100 mg 1 po BID X 7 days till culture complete. Instructed to go to [facility] ER if she continues to have temp >101 with ABX. - 03/04/2018    FAMILY HISTORY: Heart Disease - Father High Cholesterol - Mother Hypertension - Father, Mother Mother deceased at age 40 from alzheimers - Mother   SOCIAL HISTORY: Marital Status: Married Preferred Language: English; Ethnicity: Not Hispanic Or Latino; Race: White Current Smoking Status: Patient has never smoked.   Tobacco Use Assessment Completed: Used  Tobacco in last 30 days? Types of alcohol consumed: Wine. Social Drinker.  Drinks 1 caffeinated drink per day.    REVIEW OF SYSTEMS:    GU Review Female:   Patient denies frequent urination, hard to postpone urination, burning /pain with urination, get up at night to urinate, leakage of urine, stream starts and stops, trouble starting your stream, have to strain to urinate, and being pregnant.  Gastrointestinal (Upper):   Patient denies nausea, vomiting, and indigestion/ heartburn.  Gastrointestinal (Lower):   Patient denies diarrhea and constipation.  Constitutional:   Patient denies fever, night sweats, weight loss, and fatigue.  Skin:   Patient denies skin rash/ lesion and itching.  Eyes:   Patient denies blurred vision and double vision.  Ears/ Nose/ Throat:   Patient denies sore throat and sinus problems.  Hematologic/Lymphatic:   Patient denies swollen glands and easy bruising.  Cardiovascular:   Patient denies leg swelling and chest pains.  Respiratory:   Patient denies cough and shortness of breath.  Endocrine:   Patient denies excessive thirst.  Musculoskeletal:   Patient denies back pain and joint pain.  Neurological:   Patient denies headaches and dizziness.  Psychologic:   Patient denies depression and anxiety.   VITAL SIGNS:    Weight 178 lb / 80.74 kg  Height 64 in / 162.56 cm  BP  93/63 mmHg  Pulse 106 /min  Temperature 98.3 F / 36.8 C  BMI 30.6 kg/m  Notes: Patient denies bothersome symptoms and feels well today.    MULTI-SYSTEM PHYSICAL EXAMINATION:    Constitutional: Well-nourished. No physical deformities. Normally developed. Good grooming.  Respiratory: No labored breathing, no use of accessory muscles. Normal breath sounds.  Cardiovascular: Regular rate and rhythm. No murmur, no gallop. Normal temperature, normal extremity pulses, no swelling, no varicosities.  Skin: No paleness, no jaundice, no cyanosis. No lesion, no ulcer, no rash.  Neurologic / Psychiatric:  Oriented to time, oriented to place, oriented to person. No depression, no anxiety, no agitation.  Gastrointestinal: No mass, no tenderness, no rigidity, non obese abdomen.  Musculoskeletal: Spine, ribs, pelvis no bilateral tenderness. Normal gait and station of head and neck.     PAST DATA REVIEWED:  Source Of History:  Patient  Records Review:   Previous Patient Records  Urine Test Review:   Urinalysis, Urine Culture  X-Ray Review: KUB: Reviewed Films.  Renal Ultrasound (Limited): Reviewed Films.  Renal Ultrasound: Reviewed Films.  C.T. Stone Protocol: Reviewed Films. Reviewed Report.     PROCEDURES:         Renal Ultrasound (Limited) - 13086  Left Kidney: Length: 11.9 cm Depth: 5.9 cm Cortical Width: 1.2 cm Width: 5.9 cm    Left Kidney/Ureter:  Hydronephrosis and dilated renal pelvis noted. ---- Multiple echogenic foci with the largest measuring 0.4 cm ( calcifications vs vessels).   Bladder:  PVR = 26.6 ml.                KUB - [id number]  A single view of the abdomen is obtained. No obvious opacities noted within the confines of the right renal shadow or along the expected anatomical course of her right ureter. Possible small opacity noted within the confines of the left renal shadow. There are several areas of shadowing noted along expected anatomical course of her left ureter, but it is difficult for me to definitively identify one of these areas as a stone. Stable pelvic calcification. Normal bowel gas pattern.               Urinalysis w/Scope Dipstick Dipstick Cont'd Micro  Color: Yellow Bilirubin: Neg mg/dL WBC/hpf: 0 - 5/hpf  Appearance: Clear Ketones: Neg mg/dL RBC/hpf: 0 - 2/hpf  Specific Gravity: 1.020 Blood: Trace ery/uL Bacteria: NS (Not Seen)  pH: 6.5 Protein: Neg mg/dL Cystals: NS (Not Seen)  Glucose: Neg mg/dL Urobilinogen: 0.2 mg/dL Casts: NS (Not Seen)    Nitrites: Neg Trichomonas: Not Present    Leukocyte Esterase: Neg leu/uL Mucous: Present       Epithelial Cells: NS (Not Seen)      Yeast: NS (Not Seen)      Sperm: Not Present    ASSESSMENT/PLAN:     ICD-10 Details  1 GU:   Ureteral calculus - N20.1 Left she has a new left ureteral stone.  It was noted on CT scan.  A follow-up ultrasound revealed continued hydronephrosis and she has remained on medical expulsive therapy but has not seen her stone passed.  Given her past history of mild ureteral stricture and the persistent hydronephrosis it was recommended that she proceed with ureteroscopy and stone extraction.

## 2018-05-29 ENCOUNTER — Ambulatory Visit (HOSPITAL_COMMUNITY): Payer: BLUE CROSS/BLUE SHIELD

## 2018-05-29 ENCOUNTER — Ambulatory Visit (HOSPITAL_BASED_OUTPATIENT_CLINIC_OR_DEPARTMENT_OTHER): Payer: BLUE CROSS/BLUE SHIELD | Admitting: Anesthesiology

## 2018-05-29 ENCOUNTER — Encounter (HOSPITAL_BASED_OUTPATIENT_CLINIC_OR_DEPARTMENT_OTHER)
Admission: RE | Disposition: A | Discharge status: Home / Self Care | Payer: Self-pay | Source: Ambulatory Visit | Attending: Urology

## 2018-05-29 ENCOUNTER — Encounter (HOSPITAL_BASED_OUTPATIENT_CLINIC_OR_DEPARTMENT_OTHER): Payer: Self-pay | Admitting: Anesthesiology

## 2018-05-29 ENCOUNTER — Ambulatory Visit (HOSPITAL_BASED_OUTPATIENT_CLINIC_OR_DEPARTMENT_OTHER)
Admission: RE | Admit: 2018-05-29 | Discharge: 2018-05-29 | Disposition: A | Discharge status: Home / Self Care | Payer: BLUE CROSS/BLUE SHIELD | Source: Ambulatory Visit | Attending: Urology | Admitting: Urology

## 2018-05-29 DIAGNOSIS — Z8249 Family history of ischemic heart disease and other diseases of the circulatory system: Secondary | ICD-10-CM | POA: Diagnosis not present

## 2018-05-29 DIAGNOSIS — Z888 Allergy status to other drugs, medicaments and biological substances status: Secondary | ICD-10-CM | POA: Diagnosis not present

## 2018-05-29 DIAGNOSIS — N201 Calculus of ureter: Secondary | ICD-10-CM

## 2018-05-29 DIAGNOSIS — Z79899 Other long term (current) drug therapy: Secondary | ICD-10-CM | POA: Diagnosis not present

## 2018-05-29 DIAGNOSIS — F419 Anxiety disorder, unspecified: Secondary | ICD-10-CM | POA: Diagnosis not present

## 2018-05-29 DIAGNOSIS — I10 Essential (primary) hypertension: Secondary | ICD-10-CM | POA: Diagnosis not present

## 2018-05-29 DIAGNOSIS — N132 Hydronephrosis with renal and ureteral calculous obstruction: Secondary | ICD-10-CM | POA: Insufficient documentation

## 2018-05-29 DIAGNOSIS — N131 Hydronephrosis with ureteral stricture, not elsewhere classified: Secondary | ICD-10-CM | POA: Insufficient documentation

## 2018-05-29 HISTORY — DX: Personal history of urinary calculi: Z87.442

## 2018-05-29 HISTORY — PX: CYSTOSCOPY/URETEROSCOPY/HOLMIUM LASER/STENT PLACEMENT: SHX6546

## 2018-05-29 HISTORY — DX: Presence of spectacles and contact lenses: Z97.3

## 2018-05-29 LAB — POCT I-STAT, CHEM 8
BUN: 18 mg/dL (ref 6–20)
Calcium, Ion: 1.2 mmol/L (ref 1.15–1.40)
Chloride: 103 mmol/L (ref 98–111)
Creatinine, Ser: 0.5 mg/dL (ref 0.44–1.00)
Glucose, Bld: 104 mg/dL — ABNORMAL HIGH (ref 70–99)
HCT: 41 % (ref 36.0–46.0)
Hemoglobin: 13.9 g/dL (ref 12.0–15.0)
Potassium: 3.8 mmol/L (ref 3.5–5.1)
Sodium: 140 mmol/L (ref 135–145)
TCO2: 25 mmol/L (ref 22–32)

## 2018-05-29 SURGERY — CYSTOSCOPY/URETEROSCOPY/HOLMIUM LASER/STENT PLACEMENT
Anesthesia: General | Site: Ureter | Laterality: Left

## 2018-05-29 MED ORDER — LIDOCAINE 2% (20 MG/ML) 5 ML SYRINGE
INTRAMUSCULAR | Status: AC
  Filled 2018-05-29: qty 5

## 2018-05-29 MED ORDER — ONDANSETRON HCL 4 MG/2ML IJ SOLN
INTRAMUSCULAR | Status: DC | PRN
  Administered 2018-05-29: 4 mg via INTRAVENOUS

## 2018-05-29 MED ORDER — FENTANYL CITRATE (PF) 100 MCG/2ML IJ SOLN
INTRAMUSCULAR | Status: AC
  Filled 2018-05-29: qty 2

## 2018-05-29 MED ORDER — HYDROCODONE-ACETAMINOPHEN 5-325 MG PO TABS
ORAL_TABLET | ORAL | Status: AC
  Filled 2018-05-29: qty 1

## 2018-05-29 MED ORDER — PROMETHAZINE HCL 25 MG/ML IJ SOLN
6.2500 mg | INTRAMUSCULAR | Status: DC | PRN
  Filled 2018-05-29: qty 1

## 2018-05-29 MED ORDER — IOHEXOL 300 MG/ML  SOLN
INTRAMUSCULAR | Status: DC | PRN
Start: 2018-05-29 — End: 2018-05-29
  Administered 2018-05-29: 8 mL via URETHRAL

## 2018-05-29 MED ORDER — SCOPOLAMINE 1 MG/3DAYS TD PT72
MEDICATED_PATCH | TRANSDERMAL | Status: AC
  Filled 2018-05-29: qty 1

## 2018-05-29 MED ORDER — KETOROLAC TROMETHAMINE 30 MG/ML IJ SOLN
INTRAMUSCULAR | Status: AC
  Filled 2018-05-29: qty 1

## 2018-05-29 MED ORDER — HYDROCODONE-ACETAMINOPHEN 10-325 MG PO TABS
1.0000 | ORAL_TABLET | Freq: Once | ORAL | Status: DC
  Filled 2018-05-29 (×2): qty 1

## 2018-05-29 MED ORDER — FENTANYL CITRATE (PF) 100 MCG/2ML IJ SOLN
25.0000 ug | INTRAMUSCULAR | Status: DC | PRN
  Filled 2018-05-29: qty 1

## 2018-05-29 MED ORDER — KETOROLAC TROMETHAMINE 30 MG/ML IJ SOLN
INTRAMUSCULAR | Status: DC | PRN
  Administered 2018-05-29: 30 mg via INTRAVENOUS

## 2018-05-29 MED ORDER — HYDROCODONE-ACETAMINOPHEN 10-325 MG PO TABS: 1.0000 | ORAL_TABLET | ORAL | 0 refills | Status: DC | PRN

## 2018-05-29 MED ORDER — PROPOFOL 10 MG/ML IV BOLUS
INTRAVENOUS | Status: DC | PRN
  Administered 2018-05-29: 180 mg via INTRAVENOUS

## 2018-05-29 MED ORDER — FENTANYL CITRATE (PF) 100 MCG/2ML IJ SOLN
INTRAMUSCULAR | Status: DC | PRN
  Administered 2018-05-29 (×4): 25 ug via INTRAVENOUS
  Administered 2018-05-29: 100 ug via INTRAVENOUS

## 2018-05-29 MED ORDER — DEXAMETHASONE SODIUM PHOSPHATE 10 MG/ML IJ SOLN
INTRAMUSCULAR | Status: AC
  Filled 2018-05-29: qty 1

## 2018-05-29 MED ORDER — LACTATED RINGERS IV SOLN
INTRAVENOUS | Status: DC
  Administered 2018-05-29 (×2): via INTRAVENOUS
  Filled 2018-05-29: qty 1000

## 2018-05-29 MED ORDER — PHENAZOPYRIDINE HCL 200 MG PO TABS: 200.0000 mg | ORAL_TABLET | Freq: Three times a day (TID) | ORAL | 0 refills | Status: DC | PRN

## 2018-05-29 MED ORDER — DEXAMETHASONE SODIUM PHOSPHATE 4 MG/ML IJ SOLN
INTRAMUSCULAR | Status: DC | PRN
  Administered 2018-05-29: 10 mg via INTRAVENOUS

## 2018-05-29 MED ORDER — LIDOCAINE HCL (CARDIAC) PF 100 MG/5ML IV SOSY
PREFILLED_SYRINGE | INTRAVENOUS | Status: DC | PRN
  Administered 2018-05-29: 100 mg via INTRAVENOUS

## 2018-05-29 MED ORDER — SCOPOLAMINE 1 MG/3DAYS TD PT72
1.0000 | MEDICATED_PATCH | TRANSDERMAL | Status: DC
  Administered 2018-05-29: 1.5 mg via TRANSDERMAL
  Filled 2018-05-29: qty 1

## 2018-05-29 MED ORDER — PROPOFOL 10 MG/ML IV BOLUS
INTRAVENOUS | Status: AC
  Filled 2018-05-29: qty 20

## 2018-05-29 MED ORDER — MIDAZOLAM HCL 5 MG/5ML IJ SOLN
INTRAMUSCULAR | Status: DC | PRN
  Administered 2018-05-29: 2 mg via INTRAVENOUS

## 2018-05-29 MED ORDER — HYDROCODONE-ACETAMINOPHEN 5-325 MG PO TABS
1.0000 | ORAL_TABLET | Freq: Once | ORAL | Status: AC
  Administered 2018-05-29: 1 via ORAL
  Filled 2018-05-29: qty 1

## 2018-05-29 MED ORDER — ONDANSETRON HCL 4 MG/2ML IJ SOLN
INTRAMUSCULAR | Status: AC
  Filled 2018-05-29: qty 2

## 2018-05-29 MED ORDER — CIPROFLOXACIN IN D5W 400 MG/200ML IV SOLN
INTRAVENOUS | Status: AC
  Filled 2018-05-29: qty 200

## 2018-05-29 MED ORDER — CIPROFLOXACIN IN D5W 400 MG/200ML IV SOLN
400.0000 mg | Freq: Once | INTRAVENOUS | Status: AC
  Administered 2018-05-29: 400 mg via INTRAVENOUS
  Filled 2018-05-29: qty 200

## 2018-05-29 MED ORDER — SODIUM CHLORIDE 0.9 % IR SOLN
Status: DC | PRN
  Administered 2018-05-29: 3000 mL via INTRAVESICAL

## 2018-05-29 MED ORDER — MIDAZOLAM HCL 2 MG/2ML IJ SOLN
INTRAMUSCULAR | Status: AC
  Filled 2018-05-29: qty 2

## 2018-05-29 SURGICAL SUPPLY — 28 items
BAG DRAIN URO-CYSTO SKYTR STRL (DRAIN) ×2 IMPLANT
BAG DRN UROCATH (DRAIN) ×1
BASKET ZERO TIP NITINOL 2.4FR (BASKET) IMPLANT
BSKT STON RTRVL ZERO TP 2.4FR (BASKET)
CATH INTERMIT  6FR 70CM (CATHETERS) ×1 IMPLANT
CATH URET 5FR 28IN CONE TIP (BALLOONS)
CATH URET 5FR 70CM CONE TIP (BALLOONS) IMPLANT
CATH URET DUAL LUMEN 6-10FR 50 (CATHETERS) ×1 IMPLANT
CLOTH BEACON ORANGE TIMEOUT ST (SAFETY) ×2 IMPLANT
EXTRACTOR STONE 1.7FRX115CM (UROLOGICAL SUPPLIES) ×1 IMPLANT
FIBER LASER FLEXIVA 365 (UROLOGICAL SUPPLIES) IMPLANT
FIBER LASER TRAC TIP (UROLOGICAL SUPPLIES) IMPLANT
GLOVE BIO SURGEON STRL SZ8 (GLOVE) ×2 IMPLANT
GOWN STRL REUS W/TWL XL LVL3 (GOWN DISPOSABLE) ×2 IMPLANT
GUIDEWIRE 0.038 PTFE COATED (WIRE) IMPLANT
GUIDEWIRE ANG ZIPWIRE 038X150 (WIRE) IMPLANT
GUIDEWIRE STR DUAL SENSOR (WIRE) ×3 IMPLANT
INFUSOR MANOMETER BAG 3000ML (MISCELLANEOUS) ×1 IMPLANT
IV NS IRRIG 3000ML ARTHROMATIC (IV SOLUTION) ×4 IMPLANT
KIT TURNOVER CYSTO (KITS) ×2 IMPLANT
MANIFOLD NEPTUNE II (INSTRUMENTS) ×1 IMPLANT
NS IRRIG 500ML POUR BTL (IV SOLUTION) ×1 IMPLANT
PACK CYSTO (CUSTOM PROCEDURE TRAY) ×2 IMPLANT
SHEATH URETERAL 12FRX28CM (UROLOGICAL SUPPLIES) ×1 IMPLANT
STENT POLARIS LOOP 7FR X 24 CM (STENTS) ×1 IMPLANT
TUBE CONNECTING 12X1/4 (SUCTIONS) ×1 IMPLANT
TUBING UROLOGY SET (TUBING) ×1 IMPLANT
WATER STERILE IRR 3000ML UROMA (IV SOLUTION) IMPLANT

## 2018-05-29 NOTE — Anesthesia Procedure Notes (Signed)
Procedure Name: LMA Insertion Date/Time: 05/29/2018 9:47 AM Performed by: Justice Rocher, CRNA Pre-anesthesia Checklist: Patient identified, Emergency Drugs available, Suction available and Patient being monitored Patient Re-evaluated:Patient Re-evaluated prior to induction Oxygen Delivery Method: Circle system utilized Preoxygenation: Pre-oxygenation with 100% oxygen Induction Type: IV induction Ventilation: Mask ventilation without difficulty LMA: LMA inserted LMA Size: 4.0 Number of attempts: 1 Airway Equipment and Method: Bite block Placement Confirmation: positive ETCO2 and breath sounds checked- equal and bilateral Tube secured with: Tape Dental Injury: Teeth and Oropharynx as per pre-operative assessment

## 2018-05-29 NOTE — Anesthesia Preprocedure Evaluation (Signed)
Anesthesia Evaluation  Patient identified by MRN, date of birth, ID band Patient awake    Reviewed: Allergy & Precautions, NPO status , Patient's Chart, lab work & pertinent test results  Airway Mallampati: I       Dental no notable dental hx. (+) Teeth Intact   Pulmonary neg pulmonary ROS,    Pulmonary exam normal breath sounds clear to auscultation       Cardiovascular hypertension, Pt. on home beta blockers Normal cardiovascular exam Rhythm:Regular Rate:Normal     Neuro/Psych PSYCHIATRIC DISORDERS Anxiety negative neurological ROS     GI/Hepatic negative GI ROS, Neg liver ROS,   Endo/Other  negative endocrine ROS  Renal/GU negative Renal ROS  negative genitourinary   Musculoskeletal   Abdominal Normal abdominal exam  (+)   Peds  Hematology negative hematology ROS (+)   Anesthesia Other Findings   Reproductive/Obstetrics                             Anesthesia Physical  Anesthesia Plan  ASA: II  Anesthesia Plan: General   Post-op Pain Management:    Induction: Intravenous  PONV Risk Score and Plan: 4 or greater and Ondansetron, Dexamethasone, Scopolamine patch - Pre-op and Treatment may vary due to age or medical condition  Airway Management Planned: LMA  Additional Equipment:   Intra-op Plan:   Post-operative Plan: Extubation in OR  Informed Consent: I have reviewed the patients History and Physical, chart, labs and discussed the procedure including the risks, benefits and alternatives for the proposed anesthesia with the patient or authorized representative who has indicated his/her understanding and acceptance.   Dental advisory given  Plan Discussed with: CRNA and Surgeon  Anesthesia Plan Comments:         Anesthesia Quick Evaluation

## 2018-05-29 NOTE — Op Note (Signed)
PATIENT:  Theresa Rice  PRE-OPERATIVE DIAGNOSIS: 1. left Ureteral calculus 2.  History of left ureteral stricture  POST-OPERATIVE DIAGNOSIS: 1.  Left ureteral calculus. 2.  Left ureteral stricture  PROCEDURE:  1. Cystoscopy with left retrograde pyelogram including interpretation. 2. Dilation of left ureteral stricture 3. Ureteroscopy and stone extraction with stent placement 4. Left nephroscopy 5. Fluoroscopy time less than 1 hour  SURGEON: Claybon Jabs, MD  INDICATION: Theresa Rice is a 63 year old female with a history of calculus disease.  She initially was found to have an 8 mm long stone in her left ureter with some stricturing of the mid ureter requiring dilation at the time of her ureteroscopy in 5/19.  She then developed a second stone that was 3 mm and it failed to progress so she is brought to the operating room today for management of her 3 mm stone.  She had 3 punctate regions within the kidney and inquired about their removal and I told her I would evaluate these as well.  ANESTHESIA:  General  EBL:  Minimal  DRAINS: 7 French, 24 cm Polaris stent (no string).  DESCRIPTION OF PROCEDURE: The patient was taken to the major OR and placed on the table. General anesthesia was administered and then the patient was moved to the dorsal lithotomy position. The genitalia was sterilely prepped and draped. An official timeout was performed.  Initially the 33 French cystoscope with 30 lens was passed under direct vision into the bladder. The bladder was then fully inspected. It was noted be free of any tumors, stones or inflammatory lesions. Ureteral orifices were of normal configuration and position. A 6 French open-ended ureteral catheter was then passed through the cystoscope into the ureteral orifice in order to perform a left retrograde pyelogram.  A retrograde pyelogram was performed by injecting full-strength contrast up the left ureter under direct fluoroscopic control. It  revealed a narrow portion of the ureter extending from near the bladder approximately to the mid sacrum where the ureter then became dilated proximal to this.  The remainder of the ureter was mildly dilated with no filling defects in the intrarenal collecting system was noted to be normal as well.  I then passed a 0.038 inch floppy-tipped guidewire through the open ended catheter and into the area of the renal pelvis and this was left in place.  I then passed the dual-lumen ureteral catheter over the guidewire but found a moderate amount of resistance in the distal ureter however this passed on into the area the renal pelvis without difficulty.  A second safety guidewire was then passed through the dual-lumen catheter and left in place and the dual-lumen catheter was removed and the safety guidewire was affixed to the drape.  Because of the slight difficulty passing the dual-lumen catheter I elected to try to gently dilate the ureter with the inner portion of the ureteral access sheath.  Over the working guidewire I passed the inner portion of the access sheath and found a moderate amount of resistance to passage of this as well but eventually this past beyond the area of greatest resistance.  I then removed this access sheath and proceeded with ureteroscopy.  A 6 French dual-lumen rigid ureteroscope was then passed under direct into the bladder and into the left orifice and up the ureter. The stone was identified it was noted to be partially embedded in the ureter likely from passage of the dilating devices.  I chose a [place] basket and when I advanced  the scope back to where the stone was located previously I noted it had partially fragmented and fallen into the ureter.  I then used the basket to extract all of the fragments.  Reinspection of the location where it had become partially embedded revealed the ureter remained intact.  No further stone fragments were identified as I advance the scope all the way up  to just below the ureteropelvic junction.  I then reinspected the ureter as I advanced the scope back down the ureter under direct visualization and noted no further stone fragments and the ureter appeared intact and free of any significant injury.  I removed the rigid ureteroscope and then proceeded with flexible ureteroscopy using the 6 French dual lumen digital scope and passed this over the working guidewire into the area the renal pelvis confirmed by fluoroscopy and visualization.  I remove the working guidewire and then inspected each calyx.  On her CT scan it appeared she had 3 punctate regions within the kidney.  The first one was identified in the upper pole and appeared to be a Randall's plaque.  The second and third had a similar appearance.  There were no free-floating stones within the kidney.  I therefore removed the ureteroscope.    I then backloaded the cystoscope over the guidewire and passed the stent over the safety guidewire into the area of the renal pelvis. As the guidewire was removed good curl was noted in the renal pelvis. The bladder was drained and the cystoscope was then removed. The patient tolerated the procedure well no intraoperative complications.  PLAN OF CARE: Discharge to home after PACU  PATIENT DISPOSITION:  PACU - hemodynamically stable.

## 2018-05-29 NOTE — Anesthesia Postprocedure Evaluation (Signed)
Anesthesia Post Note  Patient: Theresa Rice  Procedure(s) Performed: CYSTOSCOPY/RETROGRADE/URETEROSCOPY/, STONE BASKETRY, STENT PLACEMENT (Left Ureter)     Patient location during evaluation: PACU Anesthesia Type: General Level of consciousness: sedated Pain management: pain level controlled Vital Signs Assessment: post-procedure vital signs reviewed and stable Respiratory status: spontaneous breathing and respiratory function stable Cardiovascular status: stable Postop Assessment: no apparent nausea or vomiting Anesthetic complications: no    Last Vitals:  Vitals:   05/29/18 1130 05/29/18 1220  BP: (!) 155/94 138/85  Pulse: 87 69  Resp: 14 16  Temp:  36.5 C  SpO2: 96% 97%    Last Pain:  Vitals:   05/29/18 1220  TempSrc: Oral  PainSc: 3                  Zahirah Cheslock DANIEL

## 2018-05-29 NOTE — Transfer of Care (Signed)
Immediate Anesthesia Transfer of Care Note  Patient: Theresa Rice  Procedure(s) Performed: Procedure(s) (LRB): CYSTOSCOPY/RETROGRADE/URETEROSCOPY/, STONE BASKETRY, STENT PLACEMENT (Left)  Patient Location: PACU  Anesthesia Type: General  Level of Consciousness: awake, sedated, patient cooperative and responds to stimulation  Airway & Oxygen Therapy: Patient Spontanous Breathing and Patient connected to NCO2  Post-op Assessment: Report given to PACU RN, Post -op Vital signs reviewed and stable and Patient moving all extremities  Post vital signs: Reviewed and stable  Complications: No apparent anesthesia complications

## 2018-05-29 NOTE — Discharge Instructions (Signed)
 Post Anesthesia Home Care Instructions  Activity: Get plenty of rest for the remainder of the day. A responsible adult should stay with you for 24 hours following the procedure.  For the next 24 hours, DO NOT: -Drive a car -Operate machinery -Drink alcoholic beverages -Take any medication unless instructed by your physician -Make any legal decisions or sign important papers.  Meals: Start with liquid foods such as gelatin or soup. Progress to regular foods as tolerated. Avoid greasy, spicy, heavy foods. If nausea and/or vomiting occur, drink only clear liquids until the nausea and/or vomiting subsides. Call your physician if vomiting continues.  Special Instructions/Symptoms: Your throat may feel dry or sore from the anesthesia or the breathing tube placed in your throat during surgery. If this causes discomfort, gargle with warm salt water. The discomfort should disappear within 24 hours.  If you had a scopolamine patch placed behind your ear for the management of post- operative nausea and/or vomiting:  1. The medication in the patch is effective for 72 hours, after which it should be removed.  Wrap patch in a tissue and discard in the trash. Wash hands thoroughly with soap and water. 2. You may remove the patch earlier than 72 hours if you experience unpleasant side effects which may include dry mouth, dizziness or visual disturbances. 3. Avoid touching the patch. Wash your hands with soap and water after contact with the patch.   Post stone removal/stent placement surgery instructions   Definitions:  Ureter: The duct that transports urine from the kidney to the bladder. Stent: A plastic hollow tube that is placed into the ureter, from the kidney to the bladder to prevent the ureter from swelling shut.  General instructions:  Despite the fact that no skin incisions were used, the area around the ureter and bladder is raw and irritated. The stent is a foreign body which will  further irritate the bladder wall. This irritation is manifested by increased frequency of urination, both day and night, and by an increase in the urge to urinate. In some, the urge to urinate is present almost always. Sometimes the urge is strong enough that you may not be able to stop your self from urinating. The only real cure is to remove the stent and then give time for the bladder wall to heal which can't be done until the danger of the ureter swelling shut has passed. (This varies from 2-21 days).  You may see some blood in your urine while the stent is in place and a few days afterward. Do not be alarmed, even if the urine is clear for a while. Get off your feet and drink lots of fluids until clearing occurs. If you start to pass clots or don't improve, call us.  If you have a string coming from your urethra:  The stent string is attached to your ureteral stent.  Do not pull on thisIf you have a string coming from your urethra:  The stent string is attached to your ureteral stent.  Do not pull on this.  Diet:  You may return to your normal diet immediately. Because of the raw surface of your bladder, alcohol, spicy foods, foods high in acid and drinks with caffeine may cause irritation or frequency and should be used in moderation. To keep your urine flowing freely and avoid constipation, drink plenty of fluids during the day (8-10 glasses). Tip: Avoid cranberry juice because it is very acidic.  Activity:  Your physical activity doesn't need to   be restricted. However, if you are very active, you may see some blood in the urine. We suggest that you reduce your activity under the circumstances until the bleeding has stopped.  Bowels:  It is important to keep your bowels regular during the postoperative period. Straining with bowel movements can cause bleeding. A bowel movement every other day is reasonable. Use a mild laxative if needed, such as milk of magnesia 2-3 tablespoons, or 2 Dulcolax  tablets. Call if you continue to have problems. If you had been taking narcotics for pain, before, during or after your surgery, you may be constipated. Take a laxative if necessary.     Medication:  You should resume your pre-surgery medications unless told not to. DO NOT RESUME YOUR ASPIRIN, or any other medicines like ibuprofen, motrin, excedrin, advil, aleve, vitamin E, fish oil as these can all cause bleeding x 7 days. In addition you may be given an antibiotic to prevent or treat infection. Antibiotics are not always necessary. All medication should be taken as prescribed until the bottles are finished unless you are having an unusual reaction to one of the drugs.  Problems you should report to us:  a. Fever greater than 101F. b. Heavy bleeding, or clots (see notes above about blood in urine). c. Inability to urinate. d. Drug reactions (hives, rash, nausea, vomiting, diarrhea). e. Severe burning or pain with urination that is not improving.  Followup:  You will need a followup appointment to monitor your progress in most cases. Please call the office for this appointment when you get home if your appointment has not already been scheduled. Usually the first appointment will be about 5-14 days after your surgery and if you have a stent in place it will likely be removed at that time.  

## 2018-06-01 ENCOUNTER — Encounter (HOSPITAL_BASED_OUTPATIENT_CLINIC_OR_DEPARTMENT_OTHER): Payer: Self-pay | Admitting: Urology

## 2018-07-15 ENCOUNTER — Other Ambulatory Visit (HOSPITAL_COMMUNITY)
Admission: RE | Admit: 2018-07-15 | Discharge: 2018-07-15 | Disposition: A | Discharge status: Home / Self Care | Payer: BLUE CROSS/BLUE SHIELD | Source: Ambulatory Visit | Attending: Family Medicine | Admitting: Family Medicine

## 2018-07-15 ENCOUNTER — Other Ambulatory Visit: Payer: Self-pay | Admitting: Family Medicine

## 2018-07-15 DIAGNOSIS — Z124 Encounter for screening for malignant neoplasm of cervix: Secondary | ICD-10-CM | POA: Diagnosis not present

## 2018-07-21 LAB — CYTOLOGY - PAP
Diagnosis: UNDETERMINED — AB
HPV: NOT DETECTED

## 2018-08-08 ENCOUNTER — Other Ambulatory Visit: Payer: Self-pay

## 2018-08-08 ENCOUNTER — Encounter (HOSPITAL_BASED_OUTPATIENT_CLINIC_OR_DEPARTMENT_OTHER): Payer: Self-pay | Admitting: Emergency Medicine

## 2018-08-08 ENCOUNTER — Emergency Department (HOSPITAL_BASED_OUTPATIENT_CLINIC_OR_DEPARTMENT_OTHER)
Admission: EM | Admit: 2018-08-08 | Discharge: 2018-08-08 | Disposition: A | Discharge status: Home / Self Care | Payer: BLUE CROSS/BLUE SHIELD | Attending: Emergency Medicine | Admitting: Emergency Medicine

## 2018-08-08 DIAGNOSIS — E876 Hypokalemia: Secondary | ICD-10-CM

## 2018-08-08 DIAGNOSIS — Z7982 Long term (current) use of aspirin: Secondary | ICD-10-CM | POA: Insufficient documentation

## 2018-08-08 DIAGNOSIS — K529 Noninfective gastroenteritis and colitis, unspecified: Secondary | ICD-10-CM | POA: Insufficient documentation

## 2018-08-08 DIAGNOSIS — K921 Melena: Secondary | ICD-10-CM | POA: Insufficient documentation

## 2018-08-08 DIAGNOSIS — Z79899 Other long term (current) drug therapy: Secondary | ICD-10-CM | POA: Diagnosis not present

## 2018-08-08 DIAGNOSIS — I10 Essential (primary) hypertension: Secondary | ICD-10-CM | POA: Diagnosis not present

## 2018-08-08 DIAGNOSIS — Z85828 Personal history of other malignant neoplasm of skin: Secondary | ICD-10-CM | POA: Insufficient documentation

## 2018-08-08 DIAGNOSIS — K625 Hemorrhage of anus and rectum: Secondary | ICD-10-CM | POA: Diagnosis present

## 2018-08-08 LAB — COMPREHENSIVE METABOLIC PANEL
ALBUMIN: 3.6 g/dL (ref 3.5–5.0)
ALK PHOS: 85 U/L (ref 38–126)
ALT: 36 U/L (ref 0–44)
ANION GAP: 11 (ref 5–15)
AST: 24 U/L (ref 15–41)
BUN: 17 mg/dL (ref 6–20)
CALCIUM: 8.9 mg/dL (ref 8.9–10.3)
CHLORIDE: 95 mmol/L — AB (ref 98–111)
CO2: 29 mmol/L (ref 22–32)
CREATININE: 0.68 mg/dL (ref 0.44–1.00)
GFR calc non Af Amer: >60 mL/min (ref >60)
GLUCOSE: 106 mg/dL — AB (ref 70–99)
Potassium: 2.7 mmol/L — CL (ref 3.5–5.1)
SODIUM: 135 mmol/L (ref 135–145)
Total Bilirubin: 0.7 mg/dL (ref 0.3–1.2)
Total Protein: 7.2 g/dL (ref 6.5–8.1)

## 2018-08-08 LAB — CBC WITH DIFFERENTIAL/PLATELET
ABS IMMATURE GRANULOCYTES: 0.21 10*3/uL — AB (ref 0.00–0.07)
BASOS ABS: 0.1 10*3/uL (ref 0.0–0.1)
Basophils Relative: 1 %
EOS ABS: 0.2 10*3/uL (ref 0.0–0.5)
Eosinophils Relative: 2 %
HEMATOCRIT: 38.4 % (ref 36.0–46.0)
HEMOGLOBIN: 12.8 g/dL (ref 12.0–15.0)
Immature Granulocytes: 2 %
LYMPHS ABS: 4 10*3/uL (ref 0.7–4.0)
LYMPHS PCT: 39 %
MCH: 28.8 pg (ref 26.0–34.0)
MCHC: 33.3 g/dL (ref 30.0–36.0)
MCV: 86.3 fL (ref 80.0–100.0)
MONOS PCT: 9 %
Monocytes Absolute: 0.9 10*3/uL (ref 0.1–1.0)
NEUTROS PCT: 47 %
Neutro Abs: 4.8 10*3/uL (ref 1.7–7.7)
Platelets: 360 10*3/uL (ref 150–400)
RBC: 4.45 MIL/uL (ref 3.87–5.11)
RDW: 12.6 % (ref 11.5–15.5)
WBC: 10.2 10*3/uL (ref 4.0–10.5)
nRBC: 0 % (ref 0.0–0.2)

## 2018-08-08 LAB — MAGNESIUM: Magnesium: 2 mg/dL (ref 1.7–2.4)

## 2018-08-08 MED ORDER — SODIUM CHLORIDE 0.9 % IV SOLN
INTRAVENOUS | Status: DC | PRN
  Administered 2018-08-08: 500 mL via INTRAVENOUS

## 2018-08-08 MED ORDER — POTASSIUM CHLORIDE CRYS ER 20 MEQ PO TBCR
40.0000 meq | EXTENDED_RELEASE_TABLET | Freq: Once | ORAL | Status: AC
  Administered 2018-08-08: 40 meq via ORAL
  Filled 2018-08-08: qty 2

## 2018-08-08 MED ORDER — POTASSIUM CHLORIDE CRYS ER 20 MEQ PO TBCR: 40.0000 meq | EXTENDED_RELEASE_TABLET | Freq: Every day | ORAL | 0 refills | Status: DC

## 2018-08-08 MED ORDER — POTASSIUM CHLORIDE 10 MEQ/100ML IV SOLN
10.0000 meq | Freq: Once | INTRAVENOUS | Status: AC
  Administered 2018-08-08: 10 meq via INTRAVENOUS

## 2018-08-08 NOTE — ED Notes (Signed)
Pt given Rx x 1 for potassium. Pt d/c home with family. Verbalized understanding of f/u instructions. Ambulated to d/c window with steady gait.

## 2018-08-08 NOTE — Discharge Instructions (Addendum)
Follow-up stool culture result.  Return to the ER if you develop persistent vomiting, severe abdominal pain/ left lower abdominal pain, feel like you to pass out, worsening bleeding or new concerns.  You likely will have persistent blood in your stool until your infection clears. Hold your hydrochlorothiazide until K improves and physician recommends.

## 2018-08-08 NOTE — ED Triage Notes (Signed)
Patient states that she has had "stomach" bug x 1 week - she went to an urgent care and was checked on Monday or Tue. The patient reports that she is now having blood in her stools

## 2018-08-08 NOTE — ED Notes (Signed)
ED Provider at bedside. 

## 2018-08-08 NOTE — ED Provider Notes (Signed)
[facility]PART[facility] Provider Note   CSN: [id number] Arrival date & time: 08/08/18  1737     History   Chief Complaint Chief Complaint  Patient presents with  . Rectal Bleeding    HPI Theresa Rice is a 62 y.o. female.  Patient presents with worsening diarrhea and now blood in the stool starting today.  Patient said vomiting and diarrhea with stomach cramps for the past week.  Patient [place]  No other contacts with similar symptoms.  No specific new foods.  No focal abdominal pain no fevers.  Bright red blood.  Patient had colonoscopy 10 years ago which was okay.  Patient does have follow-up colonoscopy planned for approximately March.     Past Medical History:  Diagnosis Date  . Allergic rhinitis   . Anxiety   . Arthritis   . Basal cell carcinoma    face  . Endometriosis   . Endometriosis    30 yrs ago  . Heart palpitations    stress related  . History of kidney stones   . Hypercholesterolemia   . Hypertension   . Indigestion    occ  . Left ureteral stone   . Multinodular goiter   . Plantar fasciitis   . Thyroid nodule    Bilateral  . Trigger finger, left middle finger   . Wears glasses     There are no active problems to display for this patient.   Past Surgical History:  Procedure Laterality Date  . BREAST SURGERY     Right lumpectomy  . CHOLECYSTECTOMY    . COLONOSCOPY    . CYSTOSCOPY/URETEROSCOPY/HOLMIUM LASER/STENT PLACEMENT Left 05/29/2018   Procedure: CYSTOSCOPY/RETROGRADE/URETEROSCOPY/, STONE BASKETRY, STENT PLACEMENT;  Surgeon: Kathie Rhodes, MD;  Location: [facility];  Service: Urology;  Laterality: Left;  . DIAGNOSTIC LAPAROSCOPY    . EYE SURGERY    . URETEROSCOPY WITH HOLMIUM LASER LITHOTRIPSY Left 02/27/2018   Procedure: URETEROSCOPY WITH HOLMIUM LASER LITHOTRIPSY/ STENT PLACEMENT;  Surgeon: Kathie Rhodes, MD;  Location: [facility];   Service: Urology;  Laterality: Left;     OB History   None      Home Medications    Prior to Admission medications   Medication Sig Start Date End Date Taking? Authorizing Provider  acetaminophen (TYLENOL) 325 MG tablet Take 650 mg by mouth every 6 (six) hours as needed.    [provider]  ALPRAZolam Duanne Moron) 0.25 MG tablet Take 0.25 mg by mouth as needed for anxiety.     [provider]  aspirin 81 MG chewable tablet Chew 81 mg by mouth daily.     [provider]  calcium-vitamin D (OSCAL WITH D) 250-125 MG-UNIT tablet Take 1 tablet by mouth daily.    [provider]  escitalopram (LEXAPRO) 5 MG tablet Take 5 mg by mouth daily.    [provider]  fluticasone (FLONASE) 50 MCG/ACT nasal spray Place 1-2 sprays into both nostrils daily.    [provider]  HYDROcodone-acetaminophen (NORCO) 10-325 MG tablet Take 1-2 tablets by mouth every 4 (four) hours as needed for moderate pain. Maximum dose per 24 hours -8 pills. 02/27/18   Kathie Rhodes, MD  HYDROcodone-acetaminophen (NORCO) 10-325 MG tablet Take 1-2 tablets by mouth every 4 (four) hours as needed for moderate pain. Maximum dose per 24 hours - 8 pills 05/29/18   Kathie Rhodes, MD  ibuprofen (ADVIL,MOTRIN) 200 MG tablet Take 200 mg  by mouth every 6 (six) hours as needed.    [provider]  levocetirizine (XYZAL) 5 MG tablet Take 5 mg by mouth every evening.    [provider]  lisinopril-hydrochlorothiazide (PRINZIDE,ZESTORETIC) 10-12.5 MG tablet Take 1 tablet by mouth daily.    [provider]  montelukast (SINGULAIR) 10 MG tablet Take 10 mg by mouth at bedtime.    [provider]  multivitamin-iron-minerals-folic acid (THERAPEUTIC-M) TABS tablet Take 1 tablet by mouth daily.    [provider]  phenazopyridine (PYRIDIUM) 200 MG tablet Take 1 tablet (200 mg total) by mouth 3 (three) times daily as needed for pain. 05/29/18   Kathie Rhodes, MD    potassium chloride SA (K-DUR,KLOR-CON) 20 MEQ tablet Take 2 tablets (40 mEq total) by mouth daily. 08/08/18   Elnora Morrison, MD  rosuvastatin (CRESTOR) 5 MG tablet Take 5 mg by mouth daily.    [provider]    Family History History reviewed. No pertinent family history.  Social History Social History   Tobacco Use  . Smoking status: Never Smoker  . Smokeless tobacco: Never Used  Substance Use Topics  . Alcohol use: Yes    Alcohol/week: 1.0 standard drinks    Types: 1 Glasses of wine per week    Comment: occ  . Drug use: Never     Allergies   Aleve [naproxen sodium] and Lipitor [atorvastatin]   Review of Systems Review of Systems  Constitutional: Negative for chills and fever.  HENT: Negative for congestion.   Eyes: Negative for visual disturbance.  Respiratory: Negative for shortness of breath.   Cardiovascular: Negative for chest pain.  Gastrointestinal: Positive for blood in stool. Negative for abdominal pain and vomiting.  Genitourinary: Negative for dysuria and flank pain.  Musculoskeletal: Negative for back pain, neck pain and neck stiffness.  Skin: Negative for rash.  Neurological: Negative for light-headedness and headaches.     Physical Exam Updated Vital Signs BP (!) 146/86   Pulse 93   Temp 98.7 F (37.1 C) (Oral)   Resp 18   Ht 5\' 5"  (1.651 m)   Wt 81.6 kg   SpO2 99%   BMI 29.95 kg/m   Physical Exam  Constitutional: She is oriented to person, place, and time. She appears well-developed and well-nourished.  HENT:  Head: Normocephalic and atraumatic.  Eyes: Conjunctivae are normal. Right eye exhibits no discharge. Left eye exhibits no discharge.  Neck: Normal range of motion. Neck supple. No tracheal deviation present.  Cardiovascular: Normal rate.  Pulmonary/Chest: Effort normal.  Abdominal: Soft. She exhibits no distension. There is no tenderness. There is no guarding.  Musculoskeletal: She exhibits no edema.  Neurological: She  is alert and oriented to person, place, and time.  Skin: Skin is warm. No rash noted.  Psychiatric: She has a normal mood and affect.  Nursing note and vitals reviewed.    ED Treatments / Results  Labs (all labs ordered are listed, but only abnormal results are displayed) Labs Reviewed  CBC WITH DIFFERENTIAL/PLATELET - Abnormal; Notable for the following components:      Result Value   Abs Immature Granulocytes 0.21 (*)    All other components within normal limits  COMPREHENSIVE METABOLIC PANEL - Abnormal; Notable for the following components:   Potassium 2.7 (*)    Chloride 95 (*)    Glucose, Bld 106 (*)    All other components within normal limits  GASTROINTESTINAL PANEL BY PCR, STOOL (REPLACES STOOL CULTURE)  MAGNESIUM  EKG EKG Interpretation  Date/Time:  Saturday August 08 2018 20:06:01 EDT Ventricular Rate:  79 PR Interval:    QRS Duration: 94 QT Interval:  380 QTC Calculation: 436 R Axis:   60 Text Interpretation:  Sinus rhythm Low voltage, precordial leads Abnormal R-wave progression, early transition Confirmed by Elnora Morrison [phone]) on 08/08/2018 10:15:57 PM   Radiology No results found.  Procedures Procedures (including critical care time)  Medications Ordered in ED Medications  0.9 %  sodium chloride infusion (500 mLs Intravenous New Bag/Given 08/08/18 2013)  potassium chloride 10 mEq in 100 mL IVPB (10 mEq Intravenous New Bag/Given 08/08/18 2015)  potassium chloride SA (K-DUR,KLOR-CON) CR tablet 40 mEq (40 mEq Oral Given 08/08/18 2009)     Initial Impression / Assessment and Plan / ED Course  I have reviewed the triage vital signs and the nursing notes.  Pertinent labs & imaging results that were available during my care of the patient were reviewed by me and considered in my medical decision making (see chart for details).    Patient presents with clinically enteritis, no focal abdominal pain to suggest diverticulitis, no fevers or systemic  symptoms.  Patient is well-appearing in the ER.  Discussed checking screening blood work to check hemoglobin and kidney function/electrolytes and to follow-up closely with primary doctor for culture results of her stool.  Patient also has gastroenterology to follow-up with afterwards. Patient well-appearing and reassessment.  Blood work reviewed patient does have hypokalemia.  IV and oral potassium ordered.  No significant diarrhea or vomiting episodes in the ER.  IV fluid bolus given.  Patient stable for close outpatient follow-up to follow-up repeat potassium with primary doctor and stool culture.  Hemoglobin stable.  Blood work reviewed.  EKG reviewed QT normal. Results and differential diagnosis were discussed with the patient/parent/guardian. Xrays were independently reviewed by myself.  Close follow up outpatient was discussed, comfortable with the plan.   Medications  0.9 %  sodium chloride infusion (500 mLs Intravenous New Bag/Given 08/08/18 2013)  potassium chloride 10 mEq in 100 mL IVPB (10 mEq Intravenous New Bag/Given 08/08/18 2015)  potassium chloride SA (K-DUR,KLOR-CON) CR tablet 40 mEq (40 mEq Oral Given 08/08/18 2009)    Vitals:   08/08/18 1744 08/08/18 2006 08/08/18 2030 08/08/18 2100  BP: 130/90 (!) 126/97 135/82 (!) 146/86  Pulse: 100 82 76 93  Resp: 18 10 13 18   Temp: 98.7 F (37.1 C)     TempSrc: Oral     SpO2: 100% 100% 98% 99%  Weight: 81.6 kg     Height: 5\' 5"  (1.651 m)       Final diagnoses:  Blood in stool, frank  Enteritis  Hypokalemia     Final Clinical Impressions(s) / ED Diagnoses   Final diagnoses:  Blood in stool, frank  Enteritis  Hypokalemia    ED Discharge Orders         Ordered    potassium chloride SA (K-DUR,KLOR-CON) 20 MEQ tablet  Daily     08/08/18 2214           Elnora Morrison, MD 08/08/18 2216

## 2018-08-08 NOTE — ED Notes (Signed)
Lab called and reports a K level of 2.7. Dr. Reather Converse aware.

## 2018-08-10 LAB — GASTROINTESTINAL PANEL BY PCR, STOOL (REPLACES STOOL CULTURE)
ADENOVIRUS F40/41: NOT DETECTED
ASTROVIRUS: NOT DETECTED
CAMPYLOBACTER SPECIES: DETECTED — AB
CYCLOSPORA CAYETANENSIS: NOT DETECTED
Cryptosporidium: NOT DETECTED
ENTEROAGGREGATIVE E COLI (EAEC): NOT DETECTED
ENTEROTOXIGENIC E COLI (ETEC): NOT DETECTED
Entamoeba histolytica: NOT DETECTED
Enteropathogenic E coli (EPEC): NOT DETECTED
GIARDIA LAMBLIA: NOT DETECTED
Norovirus GI/GII: NOT DETECTED
PLESIMONAS SHIGELLOIDES: NOT DETECTED
Rotavirus A: NOT DETECTED
Salmonella species: NOT DETECTED
Sapovirus (I, II, IV, and V): NOT DETECTED
Shiga like toxin producing E coli (STEC): NOT DETECTED
Shigella/Enteroinvasive E coli (EIEC): NOT DETECTED
VIBRIO SPECIES: NOT DETECTED
Vibrio cholerae: NOT DETECTED
Yersinia enterocolitica: NOT DETECTED

## 2018-08-10 NOTE — ED Provider Notes (Signed)
I received notice from the laboratory that the patient's GI panel was positive for Campylobacter.  Contacted the patient by phone.  She stated that her symptoms have been very slowly improving.  Her bowel movements have been less watery and she has had no further bloody bowel movements.  No ongoing fevers.  Based on her current improvements, I recommended continuing supportive measures rather than initiating antibiotics.  I explained that if any of her symptoms persist or worsen, we would recommend antibiotics at that point.  She voiced understanding of plan and is in agreement.   Ferdinando Lodge, Wenda Overland, MD 08/10/18 [phone]

## 2020-01-21 ENCOUNTER — Ambulatory Visit: Payer: BLUE CROSS/BLUE SHIELD | Attending: Internal Medicine

## 2020-01-21 DIAGNOSIS — Z23 Encounter for immunization: Secondary | ICD-10-CM

## 2020-01-21 NOTE — Progress Notes (Signed)
   Covid-19 Vaccination Clinic  Name:  Theresa Rice    MRN: [id number] DOB: 1956-06-05  01/21/2020  [name] was observed post Covid-19 immunization for 15 minutes without incident. She was provided with Vaccine Information Sheet and instruction to access the V-Safe system.   [name] was instructed to call [phone] with any severe reactions post vaccine: Marland Kitchen Difficulty breathing  . Swelling of face and throat  . A fast heartbeat  . A bad rash all over body  . Dizziness and weakness   Immunizations Administered    Name Date Dose VIS Date Route   Pfizer COVID-19 Vaccine 01/21/2020  4:26 PM 0.3 mL 10/01/2019 Intramuscular   Manufacturer: [place]   [place]ot: [id number]   [place]: [id number]

## 2020-02-14 ENCOUNTER — Ambulatory Visit: Payer: BLUE CROSS/BLUE SHIELD | Attending: Internal Medicine

## 2020-02-14 DIAGNOSIS — Z23 Encounter for immunization: Secondary | ICD-10-CM

## 2020-02-14 NOTE — Progress Notes (Signed)
   Covid-19 Vaccination Clinic  Name:  Theresa Rice    MRN: [id number] DOB: May 28, 1956  02/14/2020  [name] was observed post Covid-19 immunization for 15 minutes without incident. She was provided with Vaccine Information Sheet and instruction to access the V-Safe system.   [name] was instructed to call 911 with any severe reactions post vaccine: Marland Kitchen Difficulty breathing  . Swelling of face and throat  . A fast heartbeat  . A bad rash all over body  . Dizziness and weakness   Immunizations Administered    Name Date Dose VIS Date Route   Pfizer COVID-19 Vaccine 02/14/2020 11:51 AM 0.3 mL 12/15/2018 Intramuscular   Manufacturer: [place]   Lot: [id number]   [place]: [id number]

## 2020-04-10 IMAGING — CT CT ABD-PELV W/O CM
1 of 2 series · 13 of 32 positions shown, 18 images · non-contrast
Comparison: None.

CLINICAL DATA: Gross hematuria this morning.  Left flank pain

EXAM:
CT ABDOMEN AND PELVIS WITHOUT CONTRAST
TECHNIQUE: Multidetector CT imaging of the abdomen and pelvis was performed
following the standard protocol without IV contrast.

[Series 2: renal standard/full · axial · 0.79mm/px · z∈[-473,-68]mm · 13 of 93 slices shown, 18 images]
[im 6/93  soft-tissue]
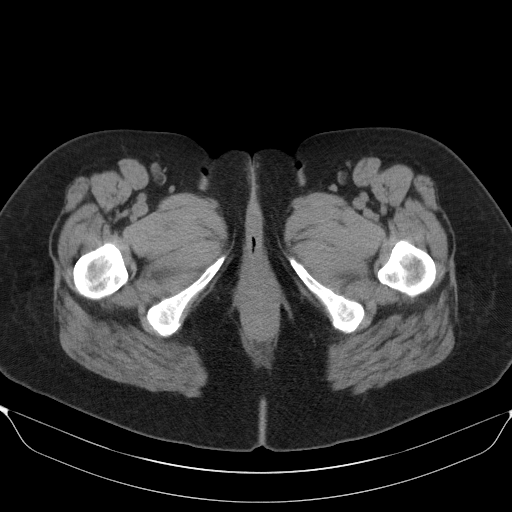
[im 6/93  bone]
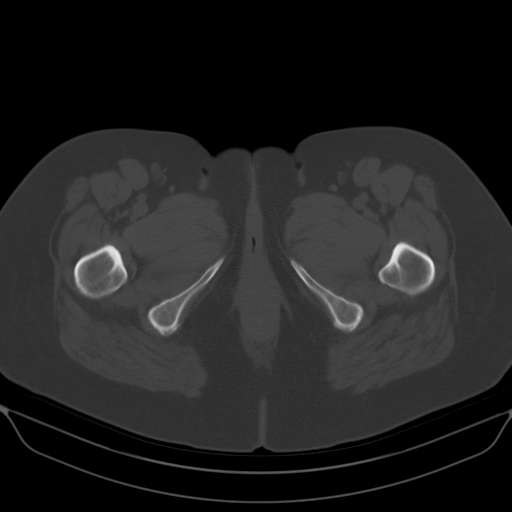
[im 16/93  soft-tissue]
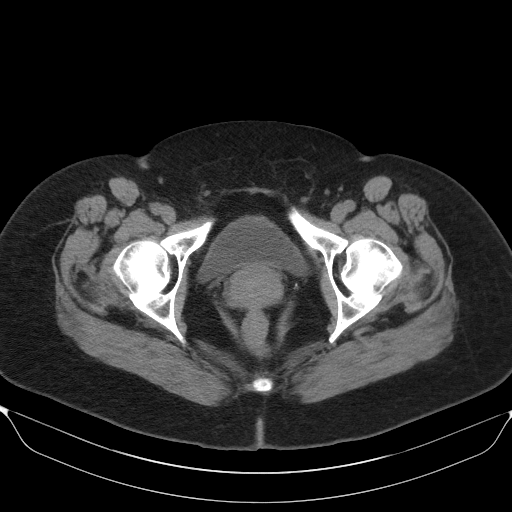
[im 21/93  soft-tissue]
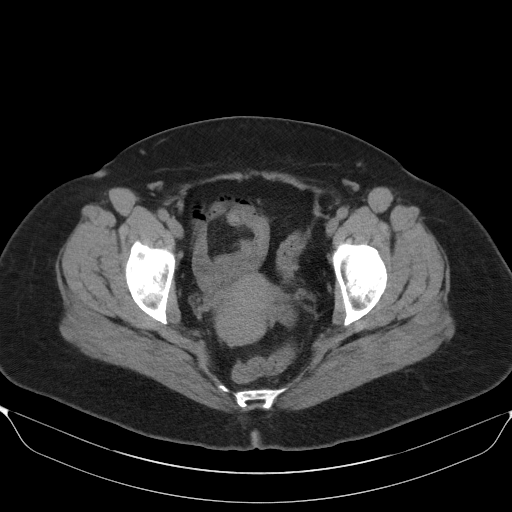
[im 26/93  soft-tissue]
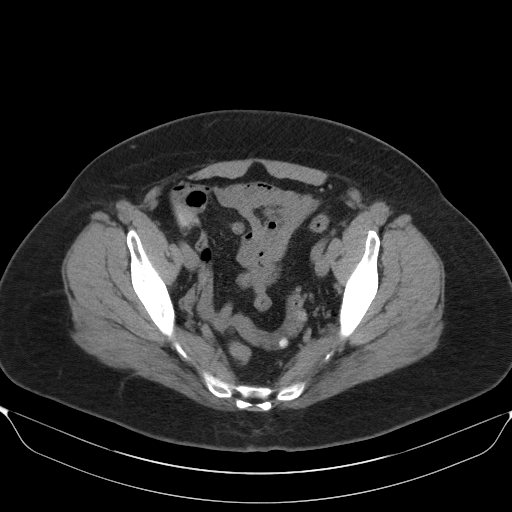
[im 36/93  soft-tissue]
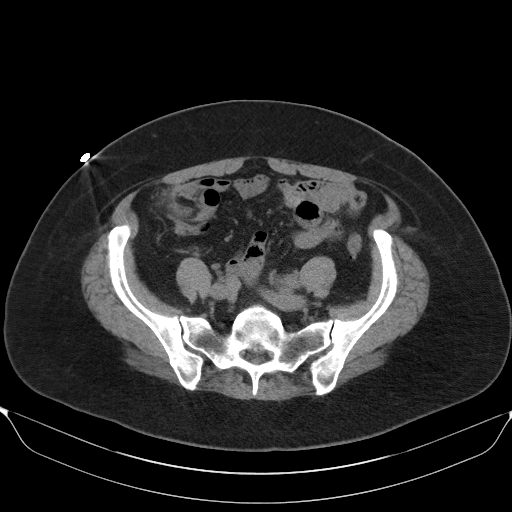
[im 41/93  soft-tissue]
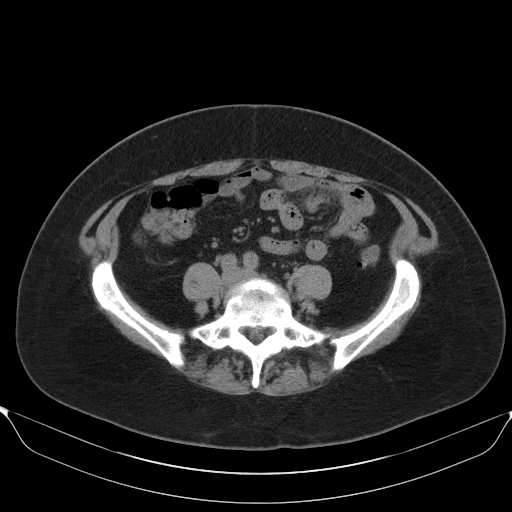
[im 52/93  soft-tissue]
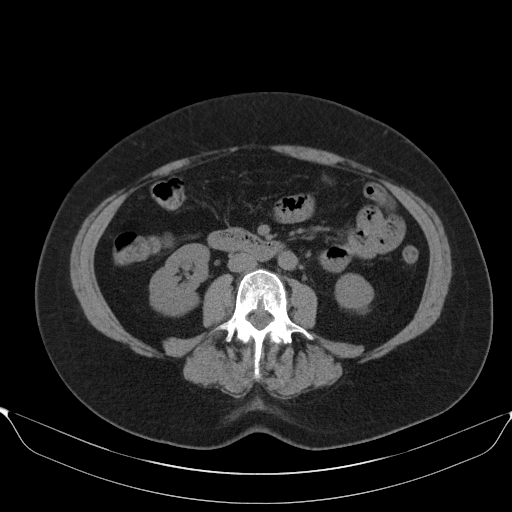
[im 57/93  soft-tissue]
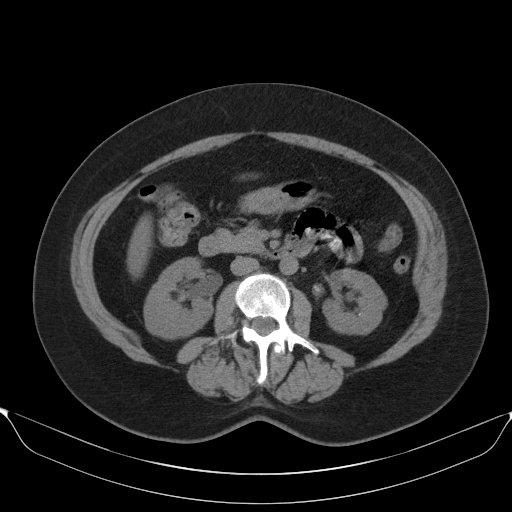
[im 67/93  soft-tissue]
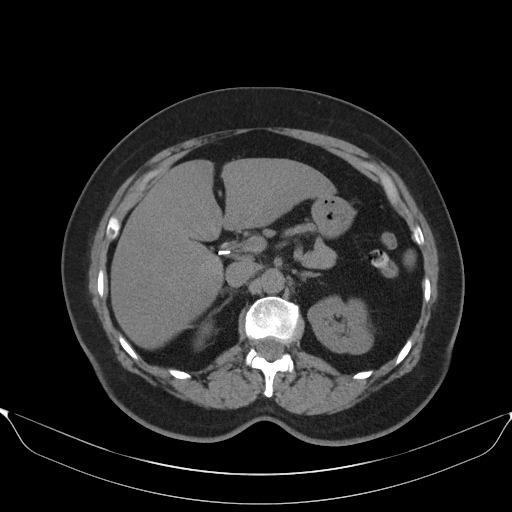
[im 67/93  bone]
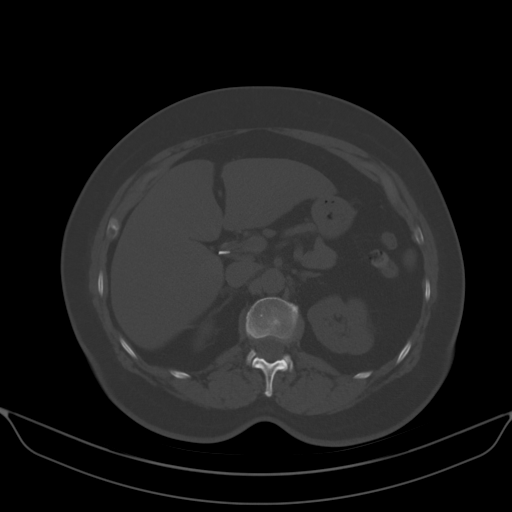
[im 72/93  soft-tissue]
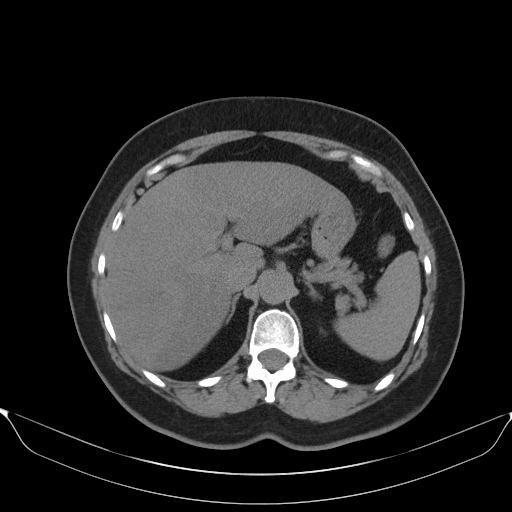
[im 72/93  lung]
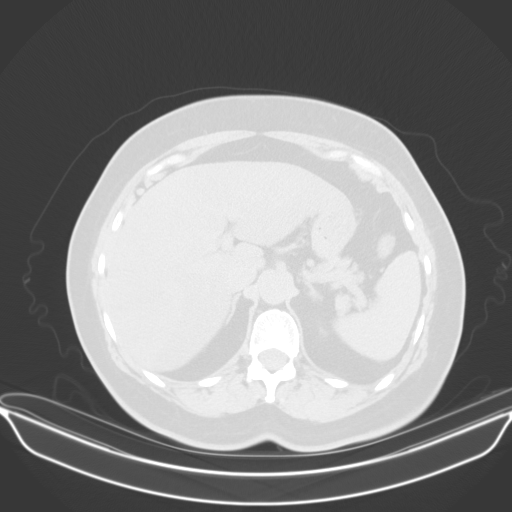
[im 77/93  soft-tissue]
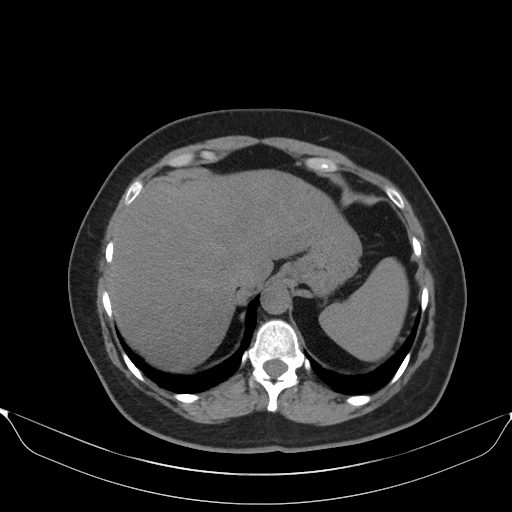
[im 77/93  lung]
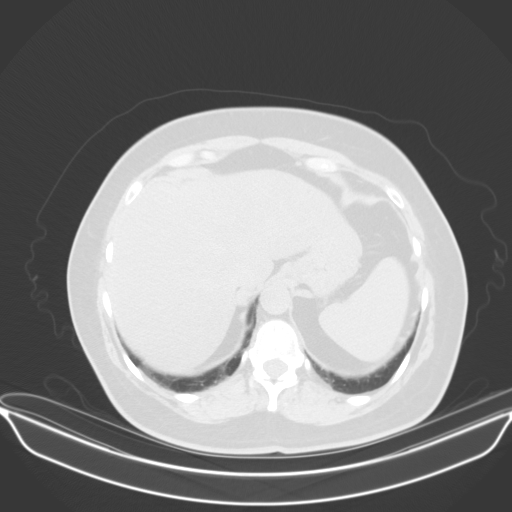
[im 82/93  lung]
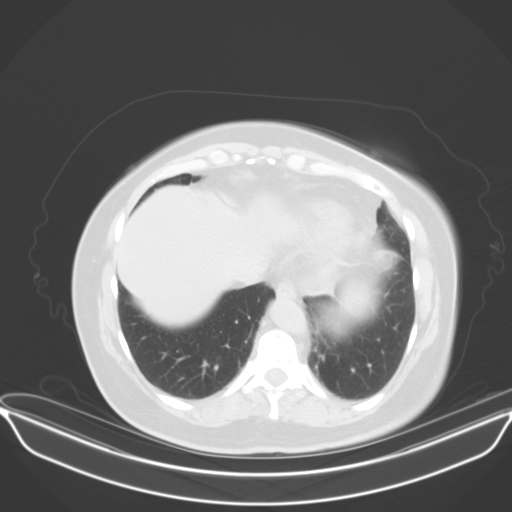
[im 87/93  soft-tissue]
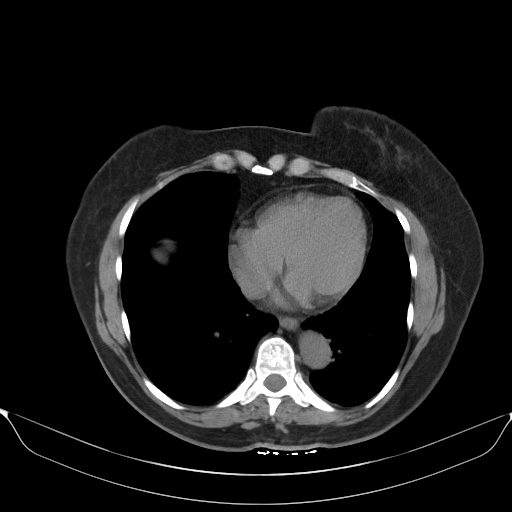
[im 87/93  lung]
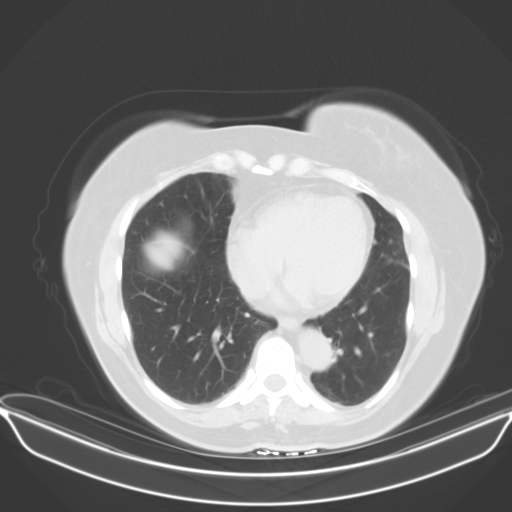

[13 of 32 positions shown; findings below may reference images not displayed]

FINDINGS: Lower chest: The lung bases are clear of acute process. No pleural
effusion or pulmonary lesions. The heart is normal in size. No
pericardial effusion. The distal esophagus and aorta are
unremarkable.

Hepatobiliary: Diffuse fatty infiltration of the liver is noted.
More focal area of low attenuation in segment 6 peripherally has a
somewhat triangular configuration and measures negative Hounsfield
units. This is most likely more focal fat. A prior infarct is also
possible. No worrisome hepatic lesions or intrahepatic biliary
dilatation. The gallbladder surgically absent. No common bile duct
dilatation.

Pancreas: No mass, inflammation or duct dilatation.

Spleen: Normal size.  No focal lesions.

Adrenals/Urinary Tract: The adrenal glands are normal.

The right kidney is unremarkable. No renal calculi or
hydronephrosis. No right-sided ureteral calculi or hydroureter.

Numerous left-sided renal calculi are noted. There are some renal
cortical scarring changes. There is mild to moderate left-sided
hydronephrosis down to an 8 mm UPJ calculus which has a somewhat
curvilinear configuration. The mid distal left ureter appears
normal. No distal ureteral calculi. No bladder calculi. No worrisome
renal or bladder masses.

Stomach/Bowel: The stomach, duodenum, small bowel and colon are
grossly normal without oral contrast. No inflammatory changes, mass
lesions or obstructive findings. The terminal ileum and appendix are
normal. Sigmoid diverticulosis without findings for acute
diverticulitis.

Vascular/Lymphatic: The aorta is normal in caliber. No
atheroscerlotic calcifications. No mesenteric of retroperitoneal
mass or adenopathy. Small scattered lymph nodes are noted.

Reproductive: The uterus and ovaries are unremarkable.

Other: No pelvic mass or adenopathy. No free pelvic fluid
collections. No inguinal mass or adenopathy. No abdominal wall
hernia or subcutaneous lesions.

Musculoskeletal: No significant bony findings.
IMPRESSION: 1. Obstructing 8 mm proximal left ureteral calculus accounting for
the patient's left flank pain and hematuria.
2. Multiple small left renal calculi.
3. No worrisome renal or bladder lesions without contrast.
4. No other acute abdominal/pelvic findings and no mass lesions or
adenopathy.

## 2020-07-20 IMAGING — CR DG ABDOMEN 1V
2 series · 2 of 2 positions shown · non-contrast
Comparison: CT scan April 24, 2018

CLINICAL DATA: Left ureteral stone.  Preoperative study.

EXAM:
ABDOMEN - 1 VIEW

[t abdomen supine (1 of 2)]
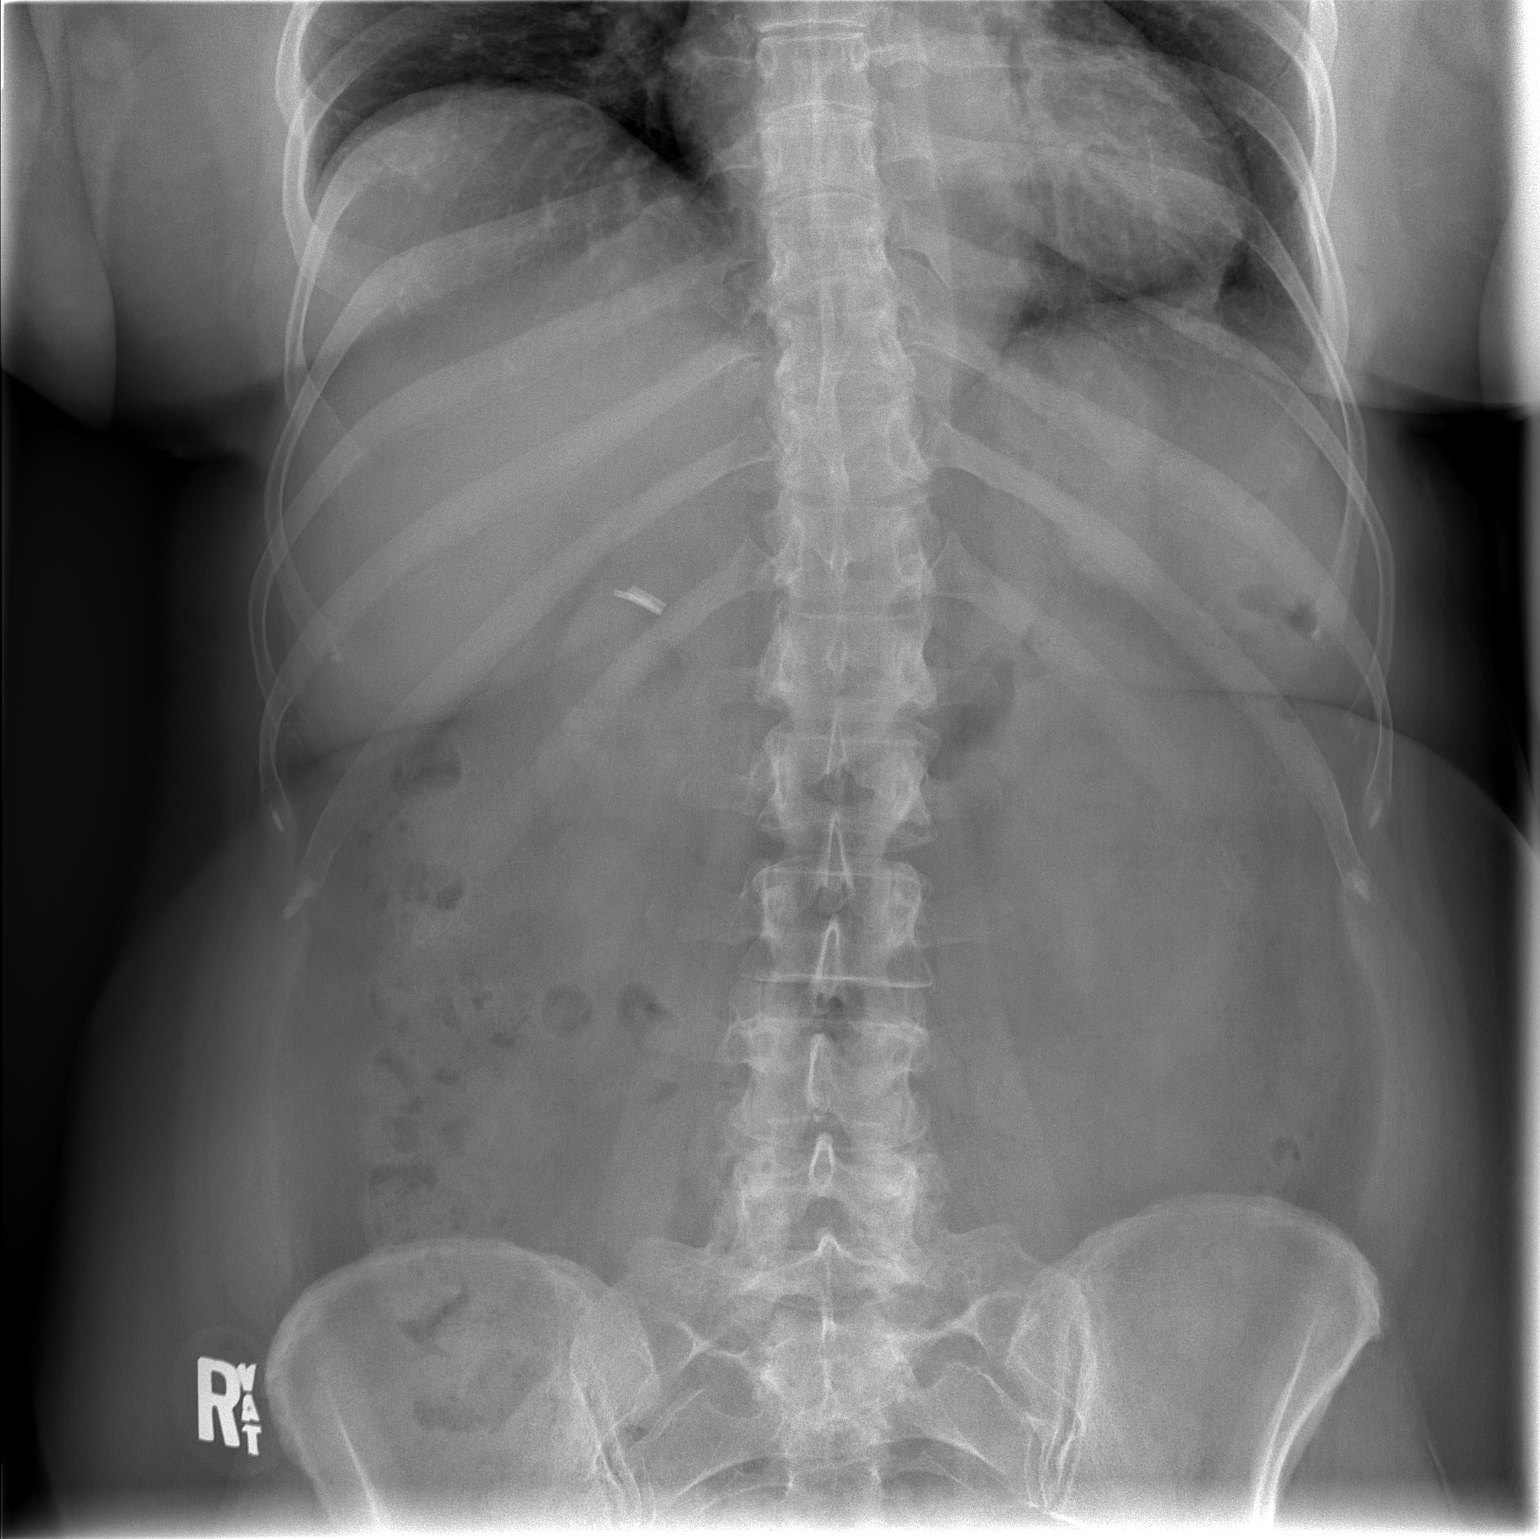

[t abdomen supine (2 of 2)]
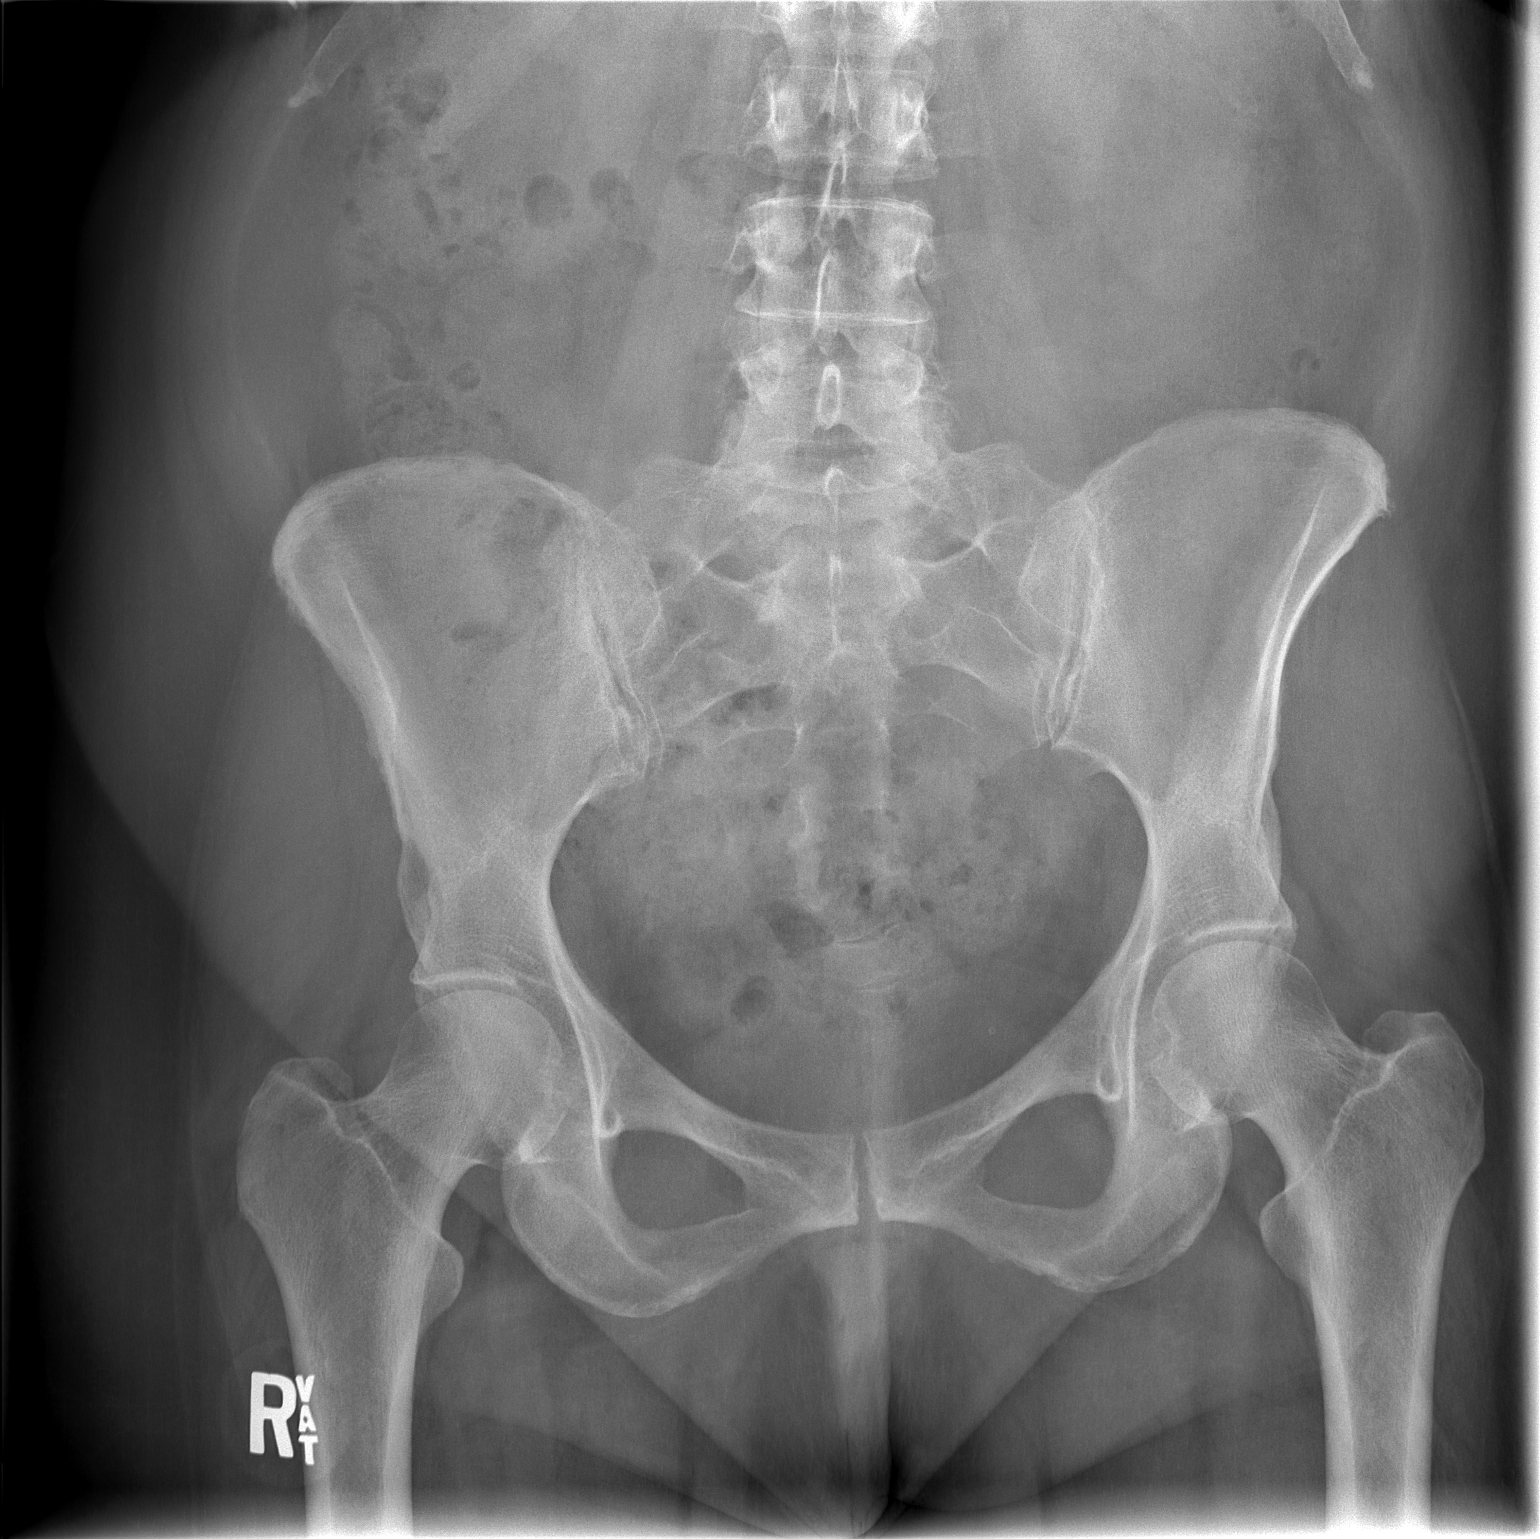

[2 of 2 positions shown; findings below may reference images not displayed]

FINDINGS: No definitive renal or ureteral stone seen on this study. A
phlebolith is seen in the deep left pelvis.
IMPRESSION: No definitive renal or ureteral stone seen on this study.

## 2022-05-07 ENCOUNTER — Other Ambulatory Visit: Payer: Self-pay | Admitting: Family Medicine

## 2022-05-07 DIAGNOSIS — E042 Nontoxic multinodular goiter: Secondary | ICD-10-CM

## 2022-05-08 ENCOUNTER — Ambulatory Visit
Admission: RE | Admit: 2022-05-08 | Discharge: 2022-05-08 | Disposition: A | Discharge status: Home / Self Care | Payer: BC Managed Care – PPO | Source: Ambulatory Visit | Attending: Family Medicine | Admitting: Family Medicine

## 2022-05-08 DIAGNOSIS — E042 Nontoxic multinodular goiter: Secondary | ICD-10-CM

## 2022-11-15 ENCOUNTER — Encounter: Payer: Self-pay | Admitting: Cardiology

## 2022-11-15 ENCOUNTER — Ambulatory Visit: Payer: BC Managed Care – PPO | Admitting: Cardiology

## 2022-11-15 VITALS — BP 128/88 | HR 67 | Resp 16 | Ht 65.0 in | Wt 172.0 lb

## 2022-11-15 DIAGNOSIS — I10 Essential (primary) hypertension: Secondary | ICD-10-CM

## 2022-11-15 DIAGNOSIS — R002 Palpitations: Secondary | ICD-10-CM

## 2022-11-15 DIAGNOSIS — E782 Mixed hyperlipidemia: Secondary | ICD-10-CM

## 2022-11-15 DIAGNOSIS — R0609 Other forms of dyspnea: Secondary | ICD-10-CM

## 2022-11-15 NOTE — Progress Notes (Signed)
ID:  Theresa Rice, DOB 01-Apr-1956, MRN [id number]  PCP:  Harlan Stains, MD  Cardiologist:  Rex Kras, DO, [facility] (established care 11/15/2022)  REASON FOR CONSULT: Palpitations and dyspnea on exertion  REQUESTING PHYSICIAN:  Harlan Stains, MD [place],   [address]  Chief Complaint  Patient presents with   Palpitations   New Patient (Initial Visit)    HPI  Theresa Rice is a 67 y.o. Caucasian female who presents to the clinic for evaluation of potation's and dyspnea on exertion at the request of Harlan Stains, MD. Her past medical history and cardiovascular risk factors include: Hypertension, hyperlipidemia, anxiety, history of COVID-19 infection.  Patient was referred to the practice for evaluation of palpitations and shortness of breath with effort related activities.  Patient states that she is doing well overall but when she exerts herself she does notice that her heart is beating faster than usual and at times associated with dyspnea.  She has been attributing the symptoms to deconditioning but when she voiced it to her PCP she was referred to cardiology for further evaluation and management.  No near-syncope or syncopal events.  She does wear a smart watch which has not detected any irregularities.  History of thyroid nodules.  Does not recall the last time the TSH was checked.  She only consumes 2 cups of coffee per day and soda consumption is very rare.  She denies use of illicits, energy drinks, stimulants, weight loss supplements, herbal supplements.  FUNCTIONAL STATUS: No structured exercise program or daily routine.   ALLERGIES: Allergies  Allergen Reactions   Aleve [Naproxen Sodium] Hives   Lipitor [Atorvastatin]     Myalgias    MEDICATION LIST PRIOR TO VISIT: Current Meds  Medication Sig   acetaminophen (TYLENOL) 325 MG tablet Take 650 mg by mouth every 6 (six) hours as needed.   ALPRAZolam (XANAX) 0.25 MG tablet Take  0.25 mg by mouth as needed for anxiety.    escitalopram (LEXAPRO) 5 MG tablet Take 5 mg by mouth daily.   fluticasone (FLONASE) 50 MCG/ACT nasal spray Place 1-2 sprays into both nostrils daily.   ibuprofen (ADVIL) 200 MG tablet Take 200 mg by mouth every 6 (six) hours as needed.   levocetirizine (XYZAL) 5 MG tablet Take 5 mg by mouth every evening.   lisinopril-hydrochlorothiazide (PRINZIDE,ZESTORETIC) 10-12.5 MG tablet Take 1 tablet by mouth daily.   montelukast (SINGULAIR) 10 MG tablet Take 10 mg by mouth at bedtime.   multivitamin-iron-minerals-folic acid (THERAPEUTIC-M) TABS tablet Take 1 tablet by mouth daily.   rosuvastatin (CRESTOR) 5 MG tablet Take 5 mg by mouth daily.     PAST MEDICAL HISTORY: Past Medical History:  Diagnosis Date   Allergic rhinitis    Anxiety    Arthritis    Basal cell carcinoma    face   Endometriosis    Endometriosis    30 yrs ago   Heart palpitations    stress related   History of kidney stones    Hypercholesterolemia    Hypertension    Indigestion    occ   Left ureteral stone    Multinodular goiter    Plantar fasciitis    Thyroid nodule    Bilateral   Trigger finger, left middle finger    Wears glasses     PAST SURGICAL HISTORY: Past Surgical History:  Procedure Laterality Date   BREAST SURGERY     Right lumpectomy   CHOLECYSTECTOMY  COLONOSCOPY     CYSTOSCOPY/URETEROSCOPY/HOLMIUM LASER/STENT PLACEMENT Left 05/29/2018   Procedure: CYSTOSCOPY/RETROGRADE/URETEROSCOPY/, STONE BASKETRY, STENT PLACEMENT;  Surgeon: Kathie Rhodes, MD;  Location: [facility];  Service: Urology;  Laterality: Left;   DIAGNOSTIC LAPAROSCOPY     EYE SURGERY     URETEROSCOPY WITH HOLMIUM LASER LITHOTRIPSY Left 02/27/2018   Procedure: URETEROSCOPY WITH HOLMIUM LASER LITHOTRIPSY/ STENT PLACEMENT;  Surgeon: Kathie Rhodes, MD;  Location: [facility];  Service: Urology;  Laterality: Left;    FAMILY HISTORY: The patient family history  includes Atrial fibrillation in her brother and father; Hyperlipidemia in her father and mother; Hypertension in her father and mother.  SOCIAL HISTORY:  The patient  reports that she has never smoked. She has never used smokeless tobacco. She reports current alcohol use of about 1.0 standard drink of alcohol per week. She reports that she does not use drugs.  REVIEW OF SYSTEMS: Review of Systems  Cardiovascular:  Positive for dyspnea on exertion and palpitations. Negative for chest pain, claudication, irregular heartbeat, leg swelling, near-syncope, orthopnea, paroxysmal nocturnal dyspnea and syncope.  Respiratory:  Negative for shortness of breath.   Hematologic/Lymphatic: Negative for bleeding problem.  Musculoskeletal:  Negative for muscle cramps and myalgias.  Neurological:  Negative for dizziness and light-headedness.    PHYSICAL EXAM:    11/15/2022    8:52 AM 11/15/2022    8:46 AM 08/08/2018   10:30 PM  Vitals with BMI  Height  '5\' 5"'$    Weight  172 lbs   BMI  06.26   Systolic 948 546 270  Diastolic 88 96 96  Pulse 67 68 84    Physical Exam  Constitutional: No distress.  Age appropriate, hemodynamically stable.   Neck: No JVD present.  Cardiovascular: Normal rate, regular rhythm, S1 normal, S2 normal, intact distal pulses and normal pulses. Exam reveals no gallop, no S3 and no S4.  No murmur heard. Pulses:      Dorsalis pedis pulses are 2+ on the right side and 2+ on the left side.       Posterior tibial pulses are 2+ on the right side and 2+ on the left side.  Pulmonary/Chest: Effort normal and breath sounds normal. No stridor. She has no wheezes. She has no rales.  Abdominal: Soft. Bowel sounds are normal. She exhibits no distension. There is no abdominal tenderness.  Musculoskeletal:        General: No edema.     Cervical back: Neck supple.  Neurological: She is alert and oriented to person, place, and time. She has intact cranial nerves (2-12).  Skin: Skin is warm  and moist.   CARDIAC DATABASE: EKG: 11/15/2022: Sinus rhythm, 64 bpm, normal axis, nonspecific T wave abnormality.  Echocardiogram: No results found for this or any previous visit from the past 1095 days.    Stress Testing: No results found for this or any previous visit from the past 1095 days.   Heart Catheterization: None  LABORATORY DATA: Reviewed date:05/07/2022 01:56:20 PM Interpretation:LDL 97, HDL 80 Performing Lab: Notes/Report: Testing Performed at: [facility], [address], [address], [place], [place] [address]  Cholesterol 197 <200 mg/dL    CHOL/HDL 2.5 2.0-4.0 Ratio    HDLD 80 30-85 mg/dL Values below 40 mg/dL indicate increased risk factor  Triglyceride 118 0-199 mg/dL    NHDL 117 0-129 mg/dL Range dependent upon risk factors.  LDL Chol Calc (NIH) 97 0-99 mg/dL     Comp Metabolic Panel Reviewed FGHW:29/93/7169 01:56:36 PM Interpretation:Normal Performing  Lab: Notes/Report: Testing Performed at: [facility], [address], [address], [place], [place] [address]  Glucose 88 70-99 mg/dL    BUN 21 6-26 mg/dL    Creatinine 0.69 0.60-1.30 mg/dl    [id number] 97 >60 calc In accordance with recommendations from NKF-ASN Task Force, [facility] has updated its eGFR calc to the 2021 CKD-EDI equation that estimates kidney function without a race variable;Stage 1 > 90 ML/Min plus Albuminuria;Stage 2 60-89 ML/MIN;Stage 3 30-59 ML/MIN;Stage 4 15-29 ML/MIN;Stage 5 <15 ML/MIN  Sodium 139 136-145 mmol/L    Potassium 4.2 3.5-5.5 mmol/L    Chloride 99 98-107 mmol/L    CO2 32 22-32 mmol/L    Anion Gap 12.8 6.0-20.0 mmol/L    Calcium 10.2 8.6-10.3 mg/dL    CA-corrected 9.32 8.60-10.30 mg/dL    Protein, Total 7.2 6.0-8.3 g/dL    Albumin 4.9 3.4-4.8 g/dL    TBIL 0.7 0.3-1.0 mg/dL    ALP 56 38-126 U/L    AST 16 0-39 U/L    ALT 17 0-52 U/L    CBC with Diff Reviewed date:05/07/2022 01:56:58 PM Interpretation:Normal Performing Lab: Notes/Report: Testing Performed at: [facility],  [address], [address], [place], [place] [address]  WBC 7.7 4.0-11.0 K/ul    RBC 4.80 4.20-5.40 M/uL    HGB 14.1 12.0-16.0 g/dL    HCT 42.7 37.0-47.0 %    MCV 88.8 81.0-99.0 fL    MCH 29.4 27.0-33.0 pg    MCHC 33.1 32.0-36.0 g/dL    RDW 13.9 11.5-15.5 %    PLT 289 150-400 K/uL    MPV 8.0 7.5-10.7 fL    NE% 36.9 43.3-71.9 %    LY% 52.5 16.8-43.5 %    MO% 6.6 4.6-12.4 %    EO% 2.8 0.0-7.8 %    BA% 1.2 0.0-1.0 %    NE# 2.9 1.9-7.2 K/uL    LY# 4.10 1.10-2.70 K/uL    MO# 0.5 0.3-0.8 K/uL    EO# 0.2 0.0-0.6 K/uL    BA# 0.1 0.0-0.1 K/uL    NRBC% 0.20      NRBC# 0.01      Thyroid Diagnostic Cascade Reviewed date:05/07/2022 01:55:49 PM Interpretation:Normal Performing Lab: Notes/Report: Testing Performed at: [facility], [address], [address], [place], [place] [address]  TSH 0.80 0.34-4.50 UlU/mL      IMPRESSION:    ICD-10-CM   1. Palpitations  R00.2 EKG 12-Lead    LONG TERM MONITOR (3-14 DAYS)    2. Dyspnea on exertion  R06.09 PCV ECHOCARDIOGRAM COMPLETE    3. Benign hypertension  I10 PCV ECHOCARDIOGRAM COMPLETE    4. Mixed hyperlipidemia  E78.2        RECOMMENDATIONS: JACKY HARTUNG is a 67 y.o. Caucasian female whose past medical history and cardiac risk factors include: Hypertension, hyperlipidemia, anxiety, history of COVID-19 infection.  Palpitations Occurs randomly and infrequently. Has an iWatch that is was able to do ECG -and during her episodes of palpitations the underlying EKGs noted " no atrial fibrillation" per patient. She has underlying thyroid nodules and has routine ultrasounds as well as TSH checks.   Last TSH and hemoglobin as of July 2023 within normal limits -outside labs independently reviewed via Care Everywhere.  EKG illustrates sinus rhythm without dysrhythmias. Discussed undergoing 2-week Zio patch to evaluate for dysrhythmias.  However since the symptoms are quite infrequent she will hold off on a Zio patch at this time.  However the  orders are available if her symptoms were to worsen.  Dyspnea on exertion  Occasional. Usually brought on by overexertion activities. Office blood pressures are well-controlled. Given her risk factors that shared decision was to proceed with echocardiogram. Further recommendations to follow  Benign hypertension Office blood pressures are well-controlled.  Medications reconciled. No changes warranted  Mixed hyperlipidemia Currently on rosuvastatin.   She denies myalgia or other side effects. Most recent lipids dated July 2023, independently reviewed as noted above. Currently managed by primary care provider.  Data Reviewed: I have independently reviewed external notes provided by the referring provider as part of this office visit.   I have independently reviewed results of EKG, labs, as part of medical decision making. I have ordered the following tests:  Orders Placed This Encounter  Procedures   LONG TERM MONITOR (3-14 DAYS)    Standing Status:   Future    Standing Expiration Date:   11/16/2023    Order Specific Question:   Where should this test be performed?    Answer:   [facility]    Order Specific Question:   Does the patient have an implanted cardiac device?    Answer:   No    Order Specific Question:   Prescribed days of wear    Answer:   82    Order Specific Question:   Type of enrollment    Answer:   Clinic Enrollment   EKG 12-Lead   [facility] ECHOCARDIOGRAM COMPLETE    Standing Status:   Future    Standing Expiration Date:   11/16/2023  I have made no  medications changes at today's encounter as noted above.  FINAL MEDICATION LIST END OF ENCOUNTER: No orders of the defined types were placed in this encounter.   Medications Discontinued During This Encounter  Medication Reason   aspirin 81 MG chewable tablet Patient Preference   calcium-vitamin D (OSCAL WITH D) 250-125 MG-UNIT tablet Patient Preference   HYDROcodone-acetaminophen (NORCO) 10-325 MG tablet  Completed Course   ibuprofen (ADVIL,MOTRIN) 200 MG tablet Completed Course   HYDROcodone-acetaminophen (NORCO) 10-325 MG tablet Patient Preference   phenazopyridine (PYRIDIUM) 200 MG tablet Patient Preference   potassium chloride SA (K-DUR,KLOR-CON) 20 MEQ tablet Patient Preference     Current Outpatient Medications:    acetaminophen (TYLENOL) 325 MG tablet, Take 650 mg by mouth every 6 (six) hours as needed., Disp: , Rfl:    ALPRAZolam (XANAX) 0.25 MG tablet, Take 0.25 mg by mouth as needed for anxiety. , Disp: , Rfl:    escitalopram (LEXAPRO) 5 MG tablet, Take 5 mg by mouth daily., Disp: , Rfl:    fluticasone (FLONASE) 50 MCG/ACT nasal spray, Place 1-2 sprays into both nostrils daily., Disp: , Rfl:    ibuprofen (ADVIL) 200 MG tablet, Take 200 mg by mouth every 6 (six) hours as needed., Disp: , Rfl:    levocetirizine (XYZAL) 5 MG tablet, Take 5 mg by mouth every evening., Disp: , Rfl:    lisinopril-hydrochlorothiazide (PRINZIDE,ZESTORETIC) 10-12.5 MG tablet, Take 1 tablet by mouth daily., Disp: , Rfl:    montelukast (SINGULAIR) 10 MG tablet, Take 10 mg by mouth at bedtime., Disp: , Rfl:    multivitamin-iron-minerals-folic acid (THERAPEUTIC-M) TABS tablet, Take 1 tablet by mouth daily., Disp: , Rfl:    rosuvastatin (CRESTOR) 5 MG tablet, Take 5 mg by mouth daily., Disp: , Rfl:   Orders Placed This Encounter  Procedures   LONG TERM MONITOR (3-14 DAYS)   EKG 12-Lead   [facility] ECHOCARDIOGRAM COMPLETE    There are no Patient Instructions on file for this visit.   --  Continue cardiac medications as reconciled in final medication list. --Return in about 4 weeks (around 12/13/2022), or if symptoms worsen or fail to improve. or sooner if needed. --Continue follow-up with your primary care physician regarding the management of your other chronic comorbid conditions.  Patient's questions and concerns were addressed to her satisfaction. She voices understanding of the instructions provided during this  encounter.   This note was created using a voice recognition software as a result there may be grammatical errors inadvertently enclosed that do not reflect the nature of this encounter. Every attempt is made to correct such errors.  Rex Kras, [place], [facility]  Pager: [phone] Office: [phone]

## 2022-11-18 ENCOUNTER — Ambulatory Visit: Payer: BC Managed Care – PPO

## 2022-11-18 DIAGNOSIS — I10 Essential (primary) hypertension: Secondary | ICD-10-CM

## 2022-11-18 DIAGNOSIS — R0609 Other forms of dyspnea: Secondary | ICD-10-CM

## 2022-12-12 ENCOUNTER — Encounter: Payer: Self-pay | Admitting: Cardiology

## 2022-12-12 ENCOUNTER — Ambulatory Visit: Payer: BC Managed Care – PPO | Admitting: Cardiology

## 2022-12-12 VITALS — BP 126/86 | HR 72 | Resp 16 | Ht 65.0 in | Wt 173.2 lb

## 2022-12-12 DIAGNOSIS — I10 Essential (primary) hypertension: Secondary | ICD-10-CM

## 2022-12-12 DIAGNOSIS — R0609 Other forms of dyspnea: Secondary | ICD-10-CM

## 2022-12-12 DIAGNOSIS — E782 Mixed hyperlipidemia: Secondary | ICD-10-CM

## 2022-12-12 DIAGNOSIS — R002 Palpitations: Secondary | ICD-10-CM

## 2022-12-12 NOTE — Progress Notes (Signed)
ID:  Theresa Rice, DOB 1955-11-30, MRN [id number]  PCP:  Harlan Stains, MD  Cardiologist:  Rex Kras, DO, [facility] (established care 11/15/2022)  Date: 12/12/22 Last Office Visit: 11/15/2022  Chief Complaint  Patient presents with   Palpitations   Follow-up    4 weeks     HPI  Theresa Rice is a 67 y.o. Caucasian female whose past medical history and cardiovascular risk factors include: Hypertension, hyperlipidemia, anxiety, history of COVID-19 infection.  Patient was referred to the practice for evaluation of palpitations and shortness of breath with effort related activities.    Patient referred to the practice for evaluation of shortness of breath and palpitations.  No identifiable reversible cause with regards to either the palpitations or dyspnea on exertion.  She has had an echocardiogram since last visit which notes a preserved LVEF, no significant valvular heart disease, normal diastolic function.  With regards to palpitations her symptoms are overall stable.  She does have an apple iWatch with the capabilities to do EKG and when she has the symptoms underlying rhythm is sinus.  She has not noted any episodes of irregular heartbeat.  A Zio patch was ordered if the symptoms were to get worse but this was not needed.  Patient states that at times there is an association with increased caffeinated beverages and symptoms of dyspnea on exertion/palpitations.  She denies chest pain at rest or with effort related activities.  FUNCTIONAL STATUS: No structured exercise program or daily routine.   ALLERGIES: Allergies  Allergen Reactions   Aleve [Naproxen Sodium] Hives   Lipitor [Atorvastatin]     Myalgias    MEDICATION LIST PRIOR TO VISIT: Current Meds  Medication Sig   acetaminophen (TYLENOL) 325 MG tablet Take 650 mg by mouth every 6 (six) hours as needed.   ALPRAZolam (XANAX) 0.25 MG tablet Take 0.25 mg by mouth as needed for anxiety.    escitalopram (LEXAPRO) 5 MG  tablet Take 5 mg by mouth daily.   fluticasone (FLONASE) 50 MCG/ACT nasal spray Place 1-2 sprays into both nostrils daily.   ibuprofen (ADVIL) 200 MG tablet Take 200 mg by mouth every 6 (six) hours as needed.   levocetirizine (XYZAL) 5 MG tablet Take 5 mg by mouth every evening.   lisinopril-hydrochlorothiazide (PRINZIDE,ZESTORETIC) 10-12.5 MG tablet Take 1 tablet by mouth daily.   montelukast (SINGULAIR) 10 MG tablet Take 10 mg by mouth at bedtime.   rosuvastatin (CRESTOR) 5 MG tablet Take 5 mg by mouth daily.     PAST MEDICAL HISTORY: Past Medical History:  Diagnosis Date   Allergic rhinitis    Anxiety    Arthritis    Basal cell carcinoma    face   Endometriosis    Endometriosis    30 yrs ago   Heart palpitations    stress related   History of kidney stones    Hypercholesterolemia    Hypertension    Indigestion    occ   Left ureteral stone    Multinodular goiter    Plantar fasciitis    Thyroid nodule    Bilateral   Trigger finger, left middle finger    Wears glasses     PAST SURGICAL HISTORY: Past Surgical History:  Procedure Laterality Date   BREAST SURGERY     Right lumpectomy   CHOLECYSTECTOMY     COLONOSCOPY     CYSTOSCOPY/URETEROSCOPY/HOLMIUM LASER/STENT PLACEMENT Left 05/29/2018   Procedure: CYSTOSCOPY/RETROGRADE/URETEROSCOPY/, STONE BASKETRY, STENT PLACEMENT;  Surgeon: Kathie Rhodes, MD;  Location: [facility]  [place];  Service: Urology;  Laterality: Left;   DIAGNOSTIC LAPAROSCOPY     EYE SURGERY     URETEROSCOPY WITH HOLMIUM LASER LITHOTRIPSY Left 02/27/2018   Procedure: URETEROSCOPY WITH HOLMIUM LASER LITHOTRIPSY/ STENT PLACEMENT;  Surgeon: Kathie Rhodes, MD;  Location: [place];  Service: Urology;  Laterality: Left;    FAMILY HISTORY: The patient family history includes Atrial fibrillation in her brother and father; Hyperlipidemia in her father and mother; Hypertension in her father and mother.  SOCIAL HISTORY:  The patient   reports that she has never smoked. She has never used smokeless tobacco. She reports current alcohol use of about 1.0 standard drink of alcohol per week. She reports that she does not use drugs.  REVIEW OF SYSTEMS: Review of Systems  Cardiovascular:  Positive for dyspnea on exertion and palpitations. Negative for chest pain, claudication, irregular heartbeat, leg swelling, near-syncope, orthopnea, paroxysmal nocturnal dyspnea and syncope.  Respiratory:  Negative for shortness of breath.   Hematologic/Lymphatic: Negative for bleeding problem.  Musculoskeletal:  Negative for muscle cramps and myalgias.  Neurological:  Negative for dizziness and light-headedness.    PHYSICAL EXAM:    12/12/2022   10:08 AM 11/15/2022    8:52 AM 11/15/2022    8:46 AM  Vitals with BMI  Height 5' 5"$   5' 5"$   Weight 173 lbs 3 oz  172 lbs  BMI 99991111  0000000  Systolic [id number] 0000000 [id number]  Diastolic 86 88 96  Pulse 72 67 68    Physical Exam  Constitutional: No distress.  Age appropriate, hemodynamically stable.   Neck: No JVD present.  Cardiovascular: Normal rate, regular rhythm, S1 normal, S2 normal, intact distal pulses and normal pulses. Exam reveals no gallop, no S3 and no S4.  No murmur heard. Pulses:      Dorsalis pedis pulses are 2+ on the right side and 2+ on the left side.       Posterior tibial pulses are 2+ on the right side and 2+ on the left side.  Pulmonary/Chest: Effort normal and breath sounds normal. No stridor. She has no wheezes. She has no rales.  Abdominal: Soft. Bowel sounds are normal. She exhibits no distension. There is no abdominal tenderness.  Musculoskeletal:        General: No edema.     Cervical back: Neck supple.  Neurological: She is alert and oriented to person, place, and time. She has intact cranial nerves (2-12).  Skin: Skin is warm and moist.   CARDIAC DATABASE: EKG: 11/15/2022: Sinus rhythm, 64 bpm, normal axis, nonspecific T wave abnormality.  Echocardiogram: 11/18/2022:   Normal LV systolic function with visual EF 55-60%. Left ventricle cavity  is normal in size. Mild concentric hypertrophy of the left ventricle.  Normal global wall motion. Normal diastolic filling pattern, normal LAP.  Calculated EF 56%.  Structurally normal tricuspid valve with trace regurgitation. No evidence  of pulmonary hypertension.  No prior available for comparison.     Stress Testing: No results found for this or any previous visit from the past 1095 days.   Heart Catheterization: None  LABORATORY DATA: Reviewed date:05/07/2022 01:56:20 PM Interpretation:LDL 97, HDL 80 Performing Lab: Notes/Report: Testing Performed at: [facility], [address], [place], [place] [address]  Cholesterol 197 <200 mg/dL    CHOL/HDL 2.5 2.0-4.0 Ratio    HDLD 80 30-85 mg/dL Values below 40 mg/dL indicate increased risk factor  Triglyceride 118 0-199 mg/dL    NHDL 117 0-129 mg/dL Range  dependent upon risk factors.  LDL Chol Calc (NIH) 97 0-99 mg/dL     Comp Metabolic Panel Reviewed [id number] 01:56:36 PM Interpretation:Normal Performing Lab: Notes/Report: Testing Performed at: [facility], [address], [address], [place], [place] [address]  Glucose 88 70-99 mg/dL    BUN 21 6-26 mg/dL    Creatinine 0.69 0.60-1.30 mg/dl    [id number] >60 calc In accordance with recommendations from NKF-ASN Task Force, [facility] has updated its eGFR calc to the 2021 CKD-EDI equation that estimates kidney function without a race variable;Stage 1 > 90 ML/Min plus Albuminuria;Stage 2 60-89 ML/MIN;Stage 3 30-59 ML/MIN;Stage 4 15-29 ML/MIN;Stage 5 <15 ML/MIN  Sodium 139 136-145 mmol/L    Potassium 4.2 3.5-5.5 mmol/L    Chloride 99 98-107 mmol/L    CO2 32 22-32 mmol/L    Anion Gap 12.8 6.0-20.0 mmol/L    Calcium 10.2 8.6-10.3 mg/dL    CA-corrected 9.32 8.60-10.30 mg/dL    Protein, Total 7.2 6.0-8.3 g/dL    Albumin 4.9 3.4-4.8 g/dL    TBIL 0.7 0.3-1.0 mg/dL    ALP 56 38-126 U/L    AST 16  0-39 U/L    ALT 17 0-52 U/L    CBC with Diff Reviewed date:05/07/2022 01:56:58 PM Interpretation:Normal Performing Lab: Notes/Report: Testing Performed at: [facility], [address], [address], [place], [place] [address]  WBC 7.7 4.0-11.0 K/ul    RBC 4.80 4.20-5.40 M/uL    HGB 14.1 12.0-16.0 g/dL    HCT 42.7 37.0-47.0 %    MCV 88.8 81.0-99.0 fL    MCH 29.4 27.0-33.0 pg    MCHC 33.1 32.0-36.0 g/dL    RDW 13.9 11.5-15.5 %    PLT 289 150-400 K/uL    MPV 8.0 7.5-10.7 fL    NE% 36.9 43.3-71.9 %    LY% 52.5 16.8-43.5 %    MO% 6.6 4.6-12.4 %    EO% 2.8 0.0-7.8 %    BA% 1.2 0.0-1.0 %    NE# 2.9 1.9-7.2 K/uL    LY# 4.10 1.10-2.70 K/uL    MO# 0.5 0.3-0.8 K/uL    EO# 0.2 0.0-0.6 K/uL    BA# 0.1 0.0-0.1 K/uL    NRBC% 0.20      NRBC# 0.01      Thyroid Diagnostic Cascade Reviewed date:05/07/2022 01:55:49 PM Interpretation:Normal Performing Lab: Notes/Report: Testing Performed at: [facility], [address], [address], [place], [place] [address]  TSH 0.80 0.34-4.50 UlU/mL      IMPRESSION:    ICD-10-CM   1. Palpitations  R00.2     2. Dyspnea on exertion  R06.09 PCV CARDIAC STRESS TEST    3. Benign hypertension  I10     4. Mixed hyperlipidemia  E78.2        RECOMMENDATIONS: Theresa Rice is a 67 y.o. Caucasian female whose past medical history and cardiac risk factors include: Hypertension, hyperlipidemia, anxiety, history of COVID-19 infection.  Palpitations Dyspnea on exertion Chronic and stable.   No unifying diagnoses to explain her symptoms as of now. TSH and hemoglobin levels for July 2023 were within normal limits. Currently uses her iWatch which is able to do EKGs and has not noted irregular heartbeats. Zio patch order is available if her symptoms were to get worse in intensity frequency or duration with regards to palpitations. She has noted her symptoms being more aggressive with increased exposure to caffeinated beverages.  Patient is trying her best in  regards to reducing consumption of caffeinated products. Given her dyspnea on exertion and  risk factors as outlined above we will proceed with GXT to evaluate for exercise-induced arrhythmia/ischemia.  If overall GXT results are low risk we will follow her up on an annual basis. If her GXT results are low risk would recommend evaluating noncardiac causes of dyspnea on exertion per primary team.  Benign hypertension Office blood pressures are well-controlled. No changes warranted at this time  Mixed hyperlipidemia Currently on rosuvastatin.   She denies myalgia or other side effects. Most recent lipids dated July 2023 reviewed as noted above. Currently managed by primary care provider.  FINAL MEDICATION LIST END OF ENCOUNTER: No orders of the defined types were placed in this encounter.   Medications Discontinued During This Encounter  Medication Reason   multivitamin-iron-minerals-folic acid (THERAPEUTIC-M) TABS tablet Patient Preference     Current Outpatient Medications:    acetaminophen (TYLENOL) 325 MG tablet, Take 650 mg by mouth every 6 (six) hours as needed., Disp: , Rfl:    ALPRAZolam (XANAX) 0.25 MG tablet, Take 0.25 mg by mouth as needed for anxiety. , Disp: , Rfl:    escitalopram (LEXAPRO) 5 MG tablet, Take 5 mg by mouth daily., Disp: , Rfl:    fluticasone (FLONASE) 50 MCG/ACT nasal spray, Place 1-2 sprays into both nostrils daily., Disp: , Rfl:    ibuprofen (ADVIL) 200 MG tablet, Take 200 mg by mouth every 6 (six) hours as needed., Disp: , Rfl:    levocetirizine (XYZAL) 5 MG tablet, Take 5 mg by mouth every evening., Disp: , Rfl:    lisinopril-hydrochlorothiazide (PRINZIDE,ZESTORETIC) 10-12.5 MG tablet, Take 1 tablet by mouth daily., Disp: , Rfl:    montelukast (SINGULAIR) 10 MG tablet, Take 10 mg by mouth at bedtime., Disp: , Rfl:    rosuvastatin (CRESTOR) 5 MG tablet, Take 5 mg by mouth daily., Disp: , Rfl:   Orders Placed This Encounter  Procedures   PCV CARDIAC  STRESS TEST    There are no Patient Instructions on file for this visit.   --Continue cardiac medications as reconciled in final medication list. --Return in about 1 year (around 12/13/2023) for Follow up, Dyspnea. or sooner if needed. --Continue follow-up with your primary care physician regarding the management of your other chronic comorbid conditions.  Patient's questions and concerns were addressed to her satisfaction. She voices understanding of the instructions provided during this encounter.   This note was created using a voice recognition software as a result there may be grammatical errors inadvertently enclosed that do not reflect the nature of this encounter. Every attempt is made to correct such errors.  Rex Kras, [place], [facility]  Pager: [phone] Office: [phone]

## 2022-12-20 ENCOUNTER — Ambulatory Visit: Payer: BC Managed Care – PPO

## 2022-12-20 DIAGNOSIS — R0609 Other forms of dyspnea: Secondary | ICD-10-CM

## 2022-12-26 NOTE — Progress Notes (Signed)
Gave patient results, she acknowledged understanding and had no further questions.

## 2023-07-25 ENCOUNTER — Encounter: Payer: Self-pay | Admitting: Cardiology

## 2023-09-29 DIAGNOSIS — H524 Presbyopia: Secondary | ICD-10-CM | POA: Diagnosis not present

## 2023-10-09 DIAGNOSIS — G5701 Lesion of sciatic nerve, right lower limb: Secondary | ICD-10-CM | POA: Diagnosis not present

## 2023-11-13 DIAGNOSIS — I10 Essential (primary) hypertension: Secondary | ICD-10-CM | POA: Diagnosis not present

## 2023-11-13 DIAGNOSIS — F411 Generalized anxiety disorder: Secondary | ICD-10-CM | POA: Diagnosis not present

## 2023-11-13 DIAGNOSIS — E785 Hyperlipidemia, unspecified: Secondary | ICD-10-CM | POA: Diagnosis not present

## 2023-11-13 DIAGNOSIS — M461 Sacroiliitis, not elsewhere classified: Secondary | ICD-10-CM | POA: Diagnosis not present

## 2023-12-16 DIAGNOSIS — M7061 Trochanteric bursitis, right hip: Secondary | ICD-10-CM | POA: Diagnosis not present

## 2023-12-16 DIAGNOSIS — M545 Low back pain, unspecified: Secondary | ICD-10-CM | POA: Diagnosis not present

## 2023-12-16 DIAGNOSIS — M7062 Trochanteric bursitis, left hip: Secondary | ICD-10-CM | POA: Diagnosis not present

## 2023-12-19 ENCOUNTER — Ambulatory Visit: Payer: Self-pay | Admitting: Cardiology

## 2023-12-25 DIAGNOSIS — M7061 Trochanteric bursitis, right hip: Secondary | ICD-10-CM | POA: Diagnosis not present

## 2023-12-25 DIAGNOSIS — M545 Low back pain, unspecified: Secondary | ICD-10-CM | POA: Diagnosis not present

## 2023-12-25 DIAGNOSIS — M7062 Trochanteric bursitis, left hip: Secondary | ICD-10-CM | POA: Diagnosis not present

## 2024-01-01 DIAGNOSIS — M7061 Trochanteric bursitis, right hip: Secondary | ICD-10-CM | POA: Diagnosis not present

## 2024-01-01 DIAGNOSIS — M7062 Trochanteric bursitis, left hip: Secondary | ICD-10-CM | POA: Diagnosis not present

## 2024-01-01 DIAGNOSIS — M545 Low back pain, unspecified: Secondary | ICD-10-CM | POA: Diagnosis not present

## 2024-01-08 DIAGNOSIS — M7061 Trochanteric bursitis, right hip: Secondary | ICD-10-CM | POA: Diagnosis not present

## 2024-01-08 DIAGNOSIS — M545 Low back pain, unspecified: Secondary | ICD-10-CM | POA: Diagnosis not present

## 2024-01-08 DIAGNOSIS — M7062 Trochanteric bursitis, left hip: Secondary | ICD-10-CM | POA: Diagnosis not present

## 2024-01-15 DIAGNOSIS — M7062 Trochanteric bursitis, left hip: Secondary | ICD-10-CM | POA: Diagnosis not present

## 2024-01-15 DIAGNOSIS — M7061 Trochanteric bursitis, right hip: Secondary | ICD-10-CM | POA: Diagnosis not present

## 2024-01-15 DIAGNOSIS — M545 Low back pain, unspecified: Secondary | ICD-10-CM | POA: Diagnosis not present

## 2024-01-27 DIAGNOSIS — M545 Low back pain, unspecified: Secondary | ICD-10-CM | POA: Diagnosis not present

## 2024-01-27 DIAGNOSIS — M7062 Trochanteric bursitis, left hip: Secondary | ICD-10-CM | POA: Diagnosis not present

## 2024-01-27 DIAGNOSIS — M7061 Trochanteric bursitis, right hip: Secondary | ICD-10-CM | POA: Diagnosis not present

## 2024-02-04 DIAGNOSIS — M7062 Trochanteric bursitis, left hip: Secondary | ICD-10-CM | POA: Diagnosis not present

## 2024-02-04 DIAGNOSIS — M545 Low back pain, unspecified: Secondary | ICD-10-CM | POA: Diagnosis not present

## 2024-02-04 DIAGNOSIS — M7061 Trochanteric bursitis, right hip: Secondary | ICD-10-CM | POA: Diagnosis not present

## 2024-02-19 DIAGNOSIS — M545 Low back pain, unspecified: Secondary | ICD-10-CM | POA: Diagnosis not present

## 2024-02-19 DIAGNOSIS — M7061 Trochanteric bursitis, right hip: Secondary | ICD-10-CM | POA: Diagnosis not present

## 2024-02-19 DIAGNOSIS — M7062 Trochanteric bursitis, left hip: Secondary | ICD-10-CM | POA: Diagnosis not present

## 2024-02-22 ENCOUNTER — Other Ambulatory Visit: Payer: Self-pay

## 2024-02-22 ENCOUNTER — Ambulatory Visit
Admission: EM | Admit: 2024-02-22 | Discharge: 2024-02-22 | Disposition: A | Discharge status: Home / Self Care | Attending: Physician Assistant | Admitting: Physician Assistant

## 2024-02-22 DIAGNOSIS — R300 Dysuria: Secondary | ICD-10-CM | POA: Insufficient documentation

## 2024-02-22 DIAGNOSIS — R350 Frequency of micturition: Secondary | ICD-10-CM | POA: Insufficient documentation

## 2024-02-22 LAB — POCT URINALYSIS DIP (MANUAL ENTRY)
Bilirubin, UA: NEGATIVE
Glucose, UA: 100 mg/dL — AB
Ketones, POC UA: NEGATIVE mg/dL
Leukocytes, UA: NEGATIVE
Nitrite, UA: POSITIVE — AB
Spec Grav, UA: 1.01 (ref 1.010–1.025)
Urobilinogen, UA: 0.2 U/dL
pH, UA: 6.5 (ref 5.0–8.0)

## 2024-02-22 MED ORDER — NITROFURANTOIN MONOHYD MACRO 100 MG PO CAPS: 100.0000 mg | ORAL_CAPSULE | Freq: Two times a day (BID) | ORAL | 0 refills | Status: AC

## 2024-02-22 MED ORDER — PHENAZOPYRIDINE HCL 100 MG PO TABS: 100.0000 mg | ORAL_TABLET | Freq: Three times a day (TID) | ORAL | 0 refills | Status: AC | PRN

## 2024-02-22 NOTE — ED Provider Notes (Signed)
 [facility]    CSN: [id number] Arrival date & time: 02/22/24  4098      History   Chief Complaint Chief Complaint  Patient presents with   Urinary Frequency    HPI RIVIERA QUIRAM is a 68 y.o. female.   HPI  Patient reports bladder pain that started suddenly yesterday  She reports increased frequency but denies dysuria. She reports bladder pain with urination though She states she has had similar symptoms last year  She did take Advil and AZO last night   Past Medical History:  Diagnosis Date   Allergic rhinitis    Anxiety    Arthritis    Basal cell carcinoma    face   Endometriosis    Endometriosis    30 yrs ago   Heart palpitations    stress related   History of kidney stones    Hypercholesterolemia    Hypertension    Indigestion    occ   Left ureteral stone    Multinodular goiter    Plantar fasciitis    Thyroid  nodule    Bilateral   Trigger finger, left middle finger    Wears glasses     There are no active problems to display for this patient.   Past Surgical History:  Procedure Laterality Date   BREAST SURGERY     Right lumpectomy   CHOLECYSTECTOMY     COLONOSCOPY     CYSTOSCOPY/URETEROSCOPY/HOLMIUM LASER/STENT PLACEMENT Left 05/29/2018   Procedure: CYSTOSCOPY/RETROGRADE/URETEROSCOPY/, STONE BASKETRY, STENT PLACEMENT;  Surgeon: Ottelin, Mark, MD;  Location: [facility];  Service: Urology;  Laterality: Left;   DIAGNOSTIC LAPAROSCOPY     EYE SURGERY     URETEROSCOPY WITH HOLMIUM LASER LITHOTRIPSY Left 02/27/2018   Procedure: URETEROSCOPY WITH HOLMIUM LASER LITHOTRIPSY/ STENT PLACEMENT;  Surgeon: Ottelin, Mark, MD;  Location: [facility];  Service: Urology;  Laterality: Left;    OB History   No obstetric history on file.      Home Medications    Prior to Admission medications   Medication Sig Start Date End Date Taking? Authorizing Provider  nitrofurantoin, macrocrystal-monohydrate, (MACROBID) 100  MG capsule Take 1 capsule (100 mg total) by mouth 2 (two) times daily for 5 days. 02/22/24 02/27/24 Yes Christal Lagerstrom E, PA-C  phenazopyridine  (PYRIDIUM ) 100 MG tablet Take 1 tablet (100 mg total) by mouth 3 (three) times daily as needed for pain. 02/22/24  Yes Melia Hopes E, PA-C  acetaminophen  (TYLENOL ) 325 MG tablet Take 650 mg by mouth every 6 (six) hours as needed.    [provider]  ALPRAZolam (XANAX) 0.25 MG tablet Take 0.25 mg by mouth as needed for anxiety.     [provider]  escitalopram (LEXAPRO) 5 MG tablet Take 5 mg by mouth daily.    [provider]  fluticasone (FLONASE) 50 MCG/ACT nasal spray Place 1-2 sprays into both nostrils daily.    [provider]  ibuprofen (ADVIL) 200 MG tablet Take 200 mg by mouth every 6 (six) hours as needed.    [provider]  levocetirizine (XYZAL) 5 MG tablet Take 5 mg by mouth every evening.    [provider]  lisinopril-hydrochlorothiazide (PRINZIDE,ZESTORETIC) 10-12.5 MG tablet Take 1 tablet by mouth daily.    [provider]  montelukast (SINGULAIR) 10 MG tablet Take 10 mg by mouth at bedtime.    [provider]  rosuvastatin (CRESTOR) 5 MG tablet Take 5 mg by mouth daily.    [provider]    Family History Family History  Problem Relation Age of Onset   Hyperlipidemia Mother    Hypertension Mother    Hyperlipidemia Father    Hypertension Father    Atrial fibrillation Father    Atrial fibrillation Brother     Social History Social History   Tobacco Use   Smoking status: Never   Smokeless tobacco: Never  Vaping Use   Vaping status: Never Used  Substance Use Topics   Alcohol use: Yes    Alcohol/week: 1.0 standard drink of alcohol    Types: 1 Glasses of wine per week    Comment: occ   Drug use: Never     Allergies   Aleve [naproxen sodium], Lipitor [atorvastatin], and Nirmatrelvir-ritonavir   Review of Systems Review of Systems  Constitutional:   Negative for chills and fever.  Gastrointestinal:  Positive for abdominal pain (suprapubic pain).  Genitourinary:  Positive for dysuria, frequency and urgency. Negative for difficulty urinating, flank pain, hematuria, vaginal bleeding, vaginal discharge and vaginal pain.     Physical Exam Triage Vital Signs ED Triage Vitals  Encounter Vitals Group     BP 02/22/24 0937 135/82     Systolic BP Percentile --      Diastolic BP Percentile --      Pulse Rate 02/22/24 0937 72     Resp 02/22/24 0937 18     Temp 02/22/24 0937 97.7 F (36.5 C)     Temp Source 02/22/24 0937 Oral     SpO2 02/22/24 0937 95 %     Weight 02/22/24 0938 180 lb (81.6 kg)     Height 02/22/24 0938 5\' 5"  (1.651 m)     Head Circumference --      Peak Flow --      Pain Score 02/22/24 0938 2     Pain Loc --      Pain Education --      Exclude from Growth Chart --    No data found.  Updated Vital Signs BP 135/82 (BP Location: Right Arm)   Pulse 72   Temp 97.7 F (36.5 C) (Oral)   Resp 18   Ht 5\' 5"  (1.651 m)   Wt 180 lb (81.6 kg)   SpO2 95%   BMI 29.95 kg/m   Visual Acuity Right Eye Distance:   Left Eye Distance:   Bilateral Distance:    Right Eye Near:   Left Eye Near:    Bilateral Near:     Physical Exam Vitals reviewed.  Constitutional:      General: She is awake.     Appearance: Normal appearance. She is well-developed and well-groomed.  HENT:     Head: Normocephalic and atraumatic.  Eyes:     General: Lids are normal. Gaze aligned appropriately.     Extraocular Movements: Extraocular movements intact.     Conjunctiva/sclera: Conjunctivae normal.  Pulmonary:     Effort: Pulmonary effort is normal.  Neurological:     General: No focal deficit present.     Mental Status: She is alert and oriented to person, place, and time.     GCS: GCS eye subscore is 4. GCS verbal subscore is 5. GCS motor subscore is 6.     Cranial Nerves: No cranial nerve deficit, dysarthria or facial asymmetry.   Psychiatric:        Attention and Perception: Attention and perception normal.        Mood and Affect: Mood and affect normal.  Speech: Speech normal.        Behavior: Behavior normal. Behavior is cooperative.        Thought Content: Thought content normal.      UC Treatments / Results  Labs (all labs ordered are listed, but only abnormal results are displayed) Labs Reviewed  POCT URINALYSIS DIP (MANUAL ENTRY) - Abnormal; Notable for the following components:      Result Value   Color, UA orange (*)    Clarity, UA turbid (*)    Glucose, UA =100 (*)    Blood, UA small (*)    Protein Ur, POC trace (*)    Nitrite, UA Positive (*)    All other components within normal limits  URINE CULTURE    EKG   Radiology No results found.  Procedures Procedures (including critical care time)  Medications Ordered in UC Medications - No data to display  Initial Impression / Assessment and Plan / UC Course  I have reviewed the triage vital signs and the nursing notes.  Pertinent labs & imaging results that were available during my care of the patient were reviewed by me and considered in my medical decision making (see chart for details).      Final Clinical Impressions(s) / UC Diagnoses   Final diagnoses:  Dysuria  Urinary frequency   Patient presents today with concerns of bladder pressure and discomfort along with urinary frequency since yesterday.  She reports that over-the-counter Advil and Azo have not provided significant relief.  Urine dip is notable for nitrites, blood, glucose and protein.  Given symptom presentation we will go ahead and start Macrobid p.o. twice daily x 5 days.  Will also send in prescription for Pyridium  to assist with discomfort.  Urine culture sent off for ID and susceptibility testing.  Results to dictate further management.  ED and return precautions reviewed and provided in after visit summary.  Follow-up as needed.    Discharge  Instructions      Based on your symptoms and results of the urinalysis I believe you have a UTI I recommend the following:  I have sent in a script for Macrobid 100 mg to be taken by mouth twice per day for 5 days  Please finish the entire course of the antibiotic even if you are feeling better before it is completed unless told to stop by a medical provider or if you develop an allergic reaction.  I have also sent in a prescription for a medication called Pyridium .  You can take this up to every 8 hours as needed for urinary discomfort.  This medication can turn your urine orange so please be aware that this may occur. Stay well hydrated (at least 75 oz of water per day) and avoid holding your urine If you have any of the following please return here or go to the emergency room for more severe symptoms: persistent symptoms, fever, trouble urinating or inability to urinate, confusion, flank pain.       ED Prescriptions     Medication Sig Dispense Auth. Provider   nitrofurantoin, macrocrystal-monohydrate, (MACROBID) 100 MG capsule Take 1 capsule (100 mg total) by mouth 2 (two) times daily for 5 days. 10 capsule Walden Statz E, PA-C   phenazopyridine  (PYRIDIUM ) 100 MG tablet Take 1 tablet (100 mg total) by mouth 3 (three) times daily as needed for pain. 10 tablet Heather Streeper E, PA-C      PDMP not reviewed this encounter.   Lucresha Dismuke, Pearla Bottom, PA-C 02/22/24 1011

## 2024-02-22 NOTE — Discharge Instructions (Addendum)
 Based on your symptoms and results of the urinalysis I believe you have a UTI I recommend the following:  I have sent in a script for Macrobid 100 mg to be taken by mouth twice per day for 5 days  Please finish the entire course of the antibiotic even if you are feeling better before it is completed unless told to stop by a medical provider or if you develop an allergic reaction.  I have also sent in a prescription for a medication called Pyridium .  You can take this up to every 8 hours as needed for urinary discomfort.  This medication can turn your urine orange so please be aware that this may occur. Stay well hydrated (at least 75 oz of water per day) and avoid holding your urine If you have any of the following please return here or go to the emergency room for more severe symptoms: persistent symptoms, fever, trouble urinating or inability to urinate, confusion, flank pain.

## 2024-02-22 NOTE — ED Triage Notes (Signed)
 Pt presents with complaints of urinary frequency and bladder discomfort that began yesterday 5/3. Pt currently rates her overall pain a 2/10. OTC Ibuprofen taken, last dose at 0500 this AM with some relief. Heating pad also applied.

## 2024-02-23 LAB — URINE CULTURE: Culture: NO GROWTH

## 2024-02-24 DIAGNOSIS — N3000 Acute cystitis without hematuria: Secondary | ICD-10-CM | POA: Diagnosis not present

## 2024-02-24 DIAGNOSIS — R102 Pelvic and perineal pain: Secondary | ICD-10-CM | POA: Diagnosis not present

## 2024-02-27 DIAGNOSIS — M7062 Trochanteric bursitis, left hip: Secondary | ICD-10-CM | POA: Diagnosis not present

## 2024-02-27 DIAGNOSIS — M545 Low back pain, unspecified: Secondary | ICD-10-CM | POA: Diagnosis not present

## 2024-02-27 DIAGNOSIS — M7061 Trochanteric bursitis, right hip: Secondary | ICD-10-CM | POA: Diagnosis not present

## 2024-03-03 DIAGNOSIS — R102 Pelvic and perineal pain: Secondary | ICD-10-CM | POA: Diagnosis not present

## 2024-03-03 DIAGNOSIS — K5792 Diverticulitis of intestine, part unspecified, without perforation or abscess without bleeding: Secondary | ICD-10-CM | POA: Diagnosis not present

## 2024-03-23 DIAGNOSIS — H43812 Vitreous degeneration, left eye: Secondary | ICD-10-CM | POA: Diagnosis not present

## 2024-03-23 DIAGNOSIS — H35371 Puckering of macula, right eye: Secondary | ICD-10-CM | POA: Diagnosis not present

## 2024-03-23 DIAGNOSIS — H35033 Hypertensive retinopathy, bilateral: Secondary | ICD-10-CM | POA: Diagnosis not present

## 2024-03-23 DIAGNOSIS — H35413 Lattice degeneration of retina, bilateral: Secondary | ICD-10-CM | POA: Diagnosis not present

## 2024-04-03 DIAGNOSIS — A09 Infectious gastroenteritis and colitis, unspecified: Secondary | ICD-10-CM | POA: Diagnosis not present

## 2024-04-03 DIAGNOSIS — R509 Fever, unspecified: Secondary | ICD-10-CM | POA: Diagnosis not present

## 2024-04-03 DIAGNOSIS — R197 Diarrhea, unspecified: Secondary | ICD-10-CM | POA: Diagnosis not present

## 2024-04-03 DIAGNOSIS — R1110 Vomiting, unspecified: Secondary | ICD-10-CM | POA: Diagnosis not present

## 2024-04-09 DIAGNOSIS — N390 Urinary tract infection, site not specified: Secondary | ICD-10-CM | POA: Diagnosis not present

## 2024-04-09 DIAGNOSIS — K5792 Diverticulitis of intestine, part unspecified, without perforation or abscess without bleeding: Secondary | ICD-10-CM | POA: Diagnosis not present

## 2024-05-12 DIAGNOSIS — J309 Allergic rhinitis, unspecified: Secondary | ICD-10-CM | POA: Diagnosis not present

## 2024-05-12 DIAGNOSIS — F411 Generalized anxiety disorder: Secondary | ICD-10-CM | POA: Diagnosis not present

## 2024-05-12 DIAGNOSIS — I10 Essential (primary) hypertension: Secondary | ICD-10-CM | POA: Diagnosis not present

## 2024-05-12 DIAGNOSIS — Z0000 Encounter for general adult medical examination without abnormal findings: Secondary | ICD-10-CM | POA: Diagnosis not present

## 2024-05-12 DIAGNOSIS — E785 Hyperlipidemia, unspecified: Secondary | ICD-10-CM | POA: Diagnosis not present

## 2024-05-21 DIAGNOSIS — R1032 Left lower quadrant pain: Secondary | ICD-10-CM | POA: Diagnosis not present

## 2024-05-21 DIAGNOSIS — Z8719 Personal history of other diseases of the digestive system: Secondary | ICD-10-CM | POA: Diagnosis not present

## 2024-05-25 DIAGNOSIS — Z1231 Encounter for screening mammogram for malignant neoplasm of breast: Secondary | ICD-10-CM | POA: Diagnosis not present

## 2024-05-31 DIAGNOSIS — Z8719 Personal history of other diseases of the digestive system: Secondary | ICD-10-CM | POA: Diagnosis not present

## 2024-05-31 DIAGNOSIS — R932 Abnormal findings on diagnostic imaging of liver and biliary tract: Secondary | ICD-10-CM | POA: Diagnosis not present

## 2024-05-31 DIAGNOSIS — K5730 Diverticulosis of large intestine without perforation or abscess without bleeding: Secondary | ICD-10-CM | POA: Diagnosis not present

## 2024-05-31 DIAGNOSIS — K7689 Other specified diseases of liver: Secondary | ICD-10-CM | POA: Diagnosis not present

## 2024-05-31 DIAGNOSIS — R1032 Left lower quadrant pain: Secondary | ICD-10-CM | POA: Diagnosis not present

## 2024-05-31 DIAGNOSIS — Z9049 Acquired absence of other specified parts of digestive tract: Secondary | ICD-10-CM | POA: Diagnosis not present

## 2024-06-10 DIAGNOSIS — J069 Acute upper respiratory infection, unspecified: Secondary | ICD-10-CM | POA: Diagnosis not present

## 2024-06-23 DIAGNOSIS — K7689 Other specified diseases of liver: Secondary | ICD-10-CM | POA: Diagnosis not present

## 2024-06-28 DIAGNOSIS — M545 Low back pain, unspecified: Secondary | ICD-10-CM | POA: Diagnosis not present

## 2024-07-14 DIAGNOSIS — H8111 Benign paroxysmal vertigo, right ear: Secondary | ICD-10-CM | POA: Diagnosis not present

## 2024-08-11 DIAGNOSIS — K080 Exfoliation of teeth due to systemic causes: Secondary | ICD-10-CM | POA: Diagnosis not present

## 2024-09-03 ENCOUNTER — Ambulatory Visit: Attending: Orthopedic Surgery

## 2024-09-03 DIAGNOSIS — M6289 Other specified disorders of muscle: Secondary | ICD-10-CM | POA: Diagnosis not present

## 2024-09-03 DIAGNOSIS — M25552 Pain in left hip: Secondary | ICD-10-CM | POA: Insufficient documentation

## 2024-09-03 DIAGNOSIS — M25551 Pain in right hip: Secondary | ICD-10-CM | POA: Insufficient documentation

## 2024-09-03 DIAGNOSIS — M5459 Other low back pain: Secondary | ICD-10-CM | POA: Diagnosis not present

## 2024-09-03 DIAGNOSIS — M62830 Muscle spasm of back: Secondary | ICD-10-CM | POA: Insufficient documentation

## 2024-09-03 NOTE — Therapy (Signed)
 OUTPATIENT PHYSICAL THERAPY THORACOLUMBAR EVALUATION   Patient Name: Theresa Rice MRN: [id number] DOB:[birth year], 68 y.o., female Today's Date: 09/03/2024  END OF SESSION:  PT End of Session - 09/03/24 0955     Visit Number 1    Date for Recertification  10/15/24    PT Start Time 0928    PT Stop Time 0956    PT Time Calculation (min) 28 min    Activity Tolerance Patient tolerated treatment well    Behavior During Therapy WFL for tasks assessed/performed          Past Medical History:  Diagnosis Date   Allergic rhinitis    Anxiety    Arthritis    Basal cell carcinoma    face   Endometriosis    Endometriosis    30 yrs ago   Heart palpitations    stress related   History of kidney stones    Hypercholesterolemia    Hypertension    Indigestion    occ   Left ureteral stone    Multinodular goiter    Plantar fasciitis    Thyroid  nodule    Bilateral   Trigger finger, left middle finger    Wears glasses    Past Surgical History:  Procedure Laterality Date   BREAST SURGERY     Right lumpectomy   CHOLECYSTECTOMY     COLONOSCOPY     CYSTOSCOPY/URETEROSCOPY/HOLMIUM LASER/STENT PLACEMENT Left 05/29/2018   Procedure: CYSTOSCOPY/RETROGRADE/URETEROSCOPY/, STONE BASKETRY, STENT PLACEMENT;  Surgeon: Ottelin, Mark, MD;  Location: [facility];  Service: Urology;  Laterality: Left;   DIAGNOSTIC LAPAROSCOPY     EYE SURGERY     URETEROSCOPY WITH HOLMIUM LASER LITHOTRIPSY Left 02/27/2018   Procedure: URETEROSCOPY WITH HOLMIUM LASER LITHOTRIPSY/ STENT PLACEMENT;  Surgeon: Ottelin, Mark, MD;  Location: [facility] ;  Service: Urology;  Laterality: Left;   There are no active problems to display for this patient.   PCP: Teresa Montie, MD  REFERRING PROVIDER: Beuford Anes, MD  REFERRING DIAG: Free text: LBP (M54.59)  Rationale for Evaluation and Treatment: Rehabilitation  THERAPY DIAG:  Other low back pain  Muscle tightness  Back muscle  spasm  ONSET DATE: 6-8 months ago   SUBJECTIVE:                                                                                                                                                                                           SUBJECTIVE STATEMENT: I have a history of bilateral hip pain that I went to PT for before; it did not seem to make a major difference. The lower back pain started about six to  eight month ago when I started feeling pain in the low back into the left buttock with prolonged walking. The pain usually starts at about half a mile of walking. I got a massage yesterday that has calmed my pain. Right now pain level is a 1-2/10. The highest it ever gets is a 7/10.    PERTINENT HISTORY:  See above PMH chart   PAIN:  Are you having pain? Yes: NPRS scale: 1-2/10 Pain location: Lower back bilaterally L>R Pain description: ache  Aggravating factors: Prolonged sitting or standing  Relieving factors: Heat, rest  PRECAUTIONS: None  RED FLAGS: None   WEIGHT BEARING RESTRICTIONS: No  FALLS:  Has patient fallen in last 6 months? No  LIVING ENVIRONMENT: Lives with: lives with their spouse and lives with their daughter Lives in: House/apartment Stairs: No Has following equipment at home: None  OCCUPATION: Retired   PLOF: Independent  PATIENT GOALS: Walk pain free for two miles   NEXT MD VISIT: None at the moment   OBJECTIVE:  Note: Objective measures were completed at Evaluation unless otherwise noted.  DIAGNOSTIC FINDINGS:  Arthritis   COGNITION: Overall cognitive status: Within functional limits for tasks assessed     SENSATION: WFL   POSTURE: No Significant postural limitations  PALPATION: Tightness and slight pain in sacral spine  LUMBAR ROM:   WNL; tightness in lateral flexion to the L side but no pain   LOWER EXTREMITY ROM:     WNL  LOWER EXTREMITY MMT:    WNL  LUMBAR SPECIAL TESTS:  FABER test: Negative  FUNCTIONAL TESTS:  5  times sit to stand: 12s   : 700'   GAIT: Distance walked: In Clinic  Assistive device utilized: None  Level of assistance: Complete Independence Comments: Increased walking pace; no significant deficits noted   TREATMENT DATE:   09/03/24-Eval                                                                                                                                 PATIENT EDUCATION:  Education details: HEP Person educated: Patient Education method: Programmer, Multimedia, Demonstration, and Verbal cues Education comprehension: verbalized understanding and returned demonstration  HOME EXERCISE PROGRAM:  Access Code: [email or web address] Date: 09/03/2024  Exercises - Supine Single Knee to Chest Stretch  - 1 x daily - 7 x weekly - 2 sets - 2 reps - 20 hold - Supine Lower Trunk Rotation  - 1 x daily - 7 x weekly - 2 sets - 10 reps - Sit to Stand  - 1 x daily - 7 x weekly - 2 sets - 10 reps   ASSESSMENT:  CLINICAL IMPRESSION: Patient is a 68 y.o. feamle who was seen today for physical therapy evaluation and treatment for LBP. Pt presents with low level LBP 1-2/10. Pt started experiencing pain 6 months ago and pain is more severe with prolonged walking. In session today; unable to recreate familiar LBP with palpation, ROM,  or functional tests. Plan to continue assessing SIJ. Pt exhibits higher level capacity for activity and has remained active with walking and light exercise. Pt exhibits good quality of functional mobility and will benefit from stretching and dynamic exercises. Pt will benefit from PT to increase endurance without increase in pain and stretching for tight musculature surrounding the low back.   OBJECTIVE IMPAIRMENTS: decreased endurance and pain.   ACTIVITY LIMITATIONS: Walking >1 mile without  pain   PARTICIPATION LIMITATIONS: Longer walking around neighborhood   PERSONAL FACTORS: Time since onset of injury/illness/exacerbation are also affecting patient's functional outcome.    REHAB POTENTIAL: Good  CLINICAL DECISION MAKING: Stable/uncomplicated  EVALUATION COMPLEXITY: Low   GOALS: Goals reviewed with patient? Yes  SHORT TERM GOALS: Target date: 10/01/24  Patient will be independent with initial HEP.  Baseline:  Goal status: INITIAL   LONG TERM GOALS: Target date: 11/08/24  Patient will be independent with advanced/ongoing HEP to improve outcomes and carryover.  Baseline:  Goal status: INITIAL  2.   Patient will report 50-75% improvement in low back pain to improve QOL.  Baseline: 7/10 at worst  Goal status: INITIAL  3. Patient will tolerate 2 miles of walking without increase in pain to return to performing her routine exercise program. Baseline:  Goal status: INITIAL  4.  Patient will be able to tolerate sitting for 45 minutes to an hour without an increase in LBP.  Baseline:  Goal status: INITIAL    PLAN:  PT FREQUENCY: 1x/week  PT DURATION: 8 weeks  PLANNED INTERVENTIONS: 97110-Therapeutic exercises, 97530- Therapeutic activity, [id number]- Neuromuscular re-education, 97535- Self Care, 02859- Manual therapy, G0283- Electrical stimulation (unattended), 02987- Traction (mechanical), and Patient/Family education.  PLAN FOR NEXT SESSION: Heat or STM if pain increased, assess SIJ, Stretching, strengthen hip and low back musculature    Thersia Alder, Student-PT 09/03/2024, 10:00 AM

## 2024-09-28 ENCOUNTER — Ambulatory Visit

## 2024-10-01 DIAGNOSIS — H524 Presbyopia: Secondary | ICD-10-CM | POA: Diagnosis not present

## 2024-10-04 ENCOUNTER — Ambulatory Visit

## 2024-10-04 DIAGNOSIS — M5459 Other low back pain: Secondary | ICD-10-CM | POA: Insufficient documentation

## 2024-10-04 DIAGNOSIS — M25551 Pain in right hip: Secondary | ICD-10-CM

## 2024-10-04 DIAGNOSIS — M62830 Muscle spasm of back: Secondary | ICD-10-CM | POA: Diagnosis present

## 2024-10-04 DIAGNOSIS — M25552 Pain in left hip: Secondary | ICD-10-CM | POA: Diagnosis present

## 2024-10-04 DIAGNOSIS — M6289 Other specified disorders of muscle: Secondary | ICD-10-CM

## 2024-10-04 NOTE — Therapy (Signed)
 OUTPATIENT PHYSICAL THERAPY THORACOLUMBAR TREATMENT   Patient Name: Theresa Rice MRN: [id number] DOB:Jul 31, 1956, 68 y.o., female Today's Date: 10/04/2024  END OF SESSION:  PT End of Session - 10/04/24 1007     Visit Number 2    Date for Recertification  10/30/23    PT Start Time 1010    PT Stop Time 1055    PT Time Calculation (min) 45 min    Activity Tolerance Patient tolerated treatment well    Behavior During Therapy WFL for tasks assessed/performed          Past Medical History:  Diagnosis Date   Allergic rhinitis    Anxiety    Arthritis    Basal cell carcinoma    face   Endometriosis    Endometriosis    30 yrs ago   Heart palpitations    stress related   History of kidney stones    Hypercholesterolemia    Hypertension    Indigestion    occ   Left ureteral stone    Multinodular goiter    Plantar fasciitis    Thyroid  nodule    Bilateral   Trigger finger, left middle finger    Wears glasses    Past Surgical History:  Procedure Laterality Date   BREAST SURGERY     Right lumpectomy   CHOLECYSTECTOMY     COLONOSCOPY     CYSTOSCOPY/URETEROSCOPY/HOLMIUM LASER/STENT PLACEMENT Left 05/29/2018   Procedure: CYSTOSCOPY/RETROGRADE/URETEROSCOPY/, STONE BASKETRY, STENT PLACEMENT;  Surgeon: Ottelin, Mark, MD;  Location: [facility];  Service: Urology;  Laterality: Left;   DIAGNOSTIC LAPAROSCOPY     EYE SURGERY     URETEROSCOPY WITH HOLMIUM LASER LITHOTRIPSY Left 02/27/2018   Procedure: URETEROSCOPY WITH HOLMIUM LASER LITHOTRIPSY/ STENT PLACEMENT;  Surgeon: Ottelin, Mark, MD;  Location: [facility];  Service: Urology;  Laterality: Left;   There are no active problems to display for this patient.   PCP: Teresa Montie, MD  REFERRING PROVIDER: Beuford Anes, MD  REFERRING DIAG: Free text: LBP (M54.59)  Rationale for Evaluation and Treatment: Rehabilitation  THERAPY DIAG:  Other low back pain  Muscle tightness  Back muscle  spasm  Bilateral hip pain  ONSET DATE: 6-8 months ago   SUBJECTIVE:                                                                                                                                                                                           SUBJECTIVE STATEMENT: Pt states she has just a little back soreness today upon arrival of PT. Pt reports she has not been able to do HEP as consistently recently due  to the increase in daily tasks of the holiday season.   I have a history of bilateral hip pain that I went to PT for before; it did not seem to make a major difference. The lower back pain started about six to eight month ago when I started feeling pain in the low back into the left buttock with prolonged walking. The pain usually starts at about half a mile of walking. I got a massage yesterday that has calmed my pain. Right now pain level is a 1-2/10. The highest it ever gets is a 7/10.    PERTINENT HISTORY:  See above PMH chart   PAIN:  Are you having pain? Yes: NPRS scale: 1-2/10 Pain location: Lower back bilaterally L>R Pain description: ache  Aggravating factors: Prolonged sitting or standing  Relieving factors: Heat, rest  PRECAUTIONS: None  RED FLAGS: None   WEIGHT BEARING RESTRICTIONS: No  FALLS:  Has patient fallen in last 6 months? No  LIVING ENVIRONMENT: Lives with: lives with their spouse and lives with their daughter Lives in: House/apartment Stairs: No Has following equipment at home: None  OCCUPATION: Retired   PLOF: Independent  PATIENT GOALS: Walk pain free for two miles   NEXT MD VISIT: None at the moment   OBJECTIVE:  Note: Objective measures were completed at Evaluation unless otherwise noted.  DIAGNOSTIC FINDINGS:  Arthritis   COGNITION: Overall cognitive status: Within functional limits for tasks assessed     SENSATION: WFL   POSTURE: No Significant postural limitations  PALPATION: Tightness and slight pain in sacral  spine  LUMBAR ROM:   WNL; tightness in lateral flexion to the L side but no pain   LOWER EXTREMITY ROM:     WNL  LOWER EXTREMITY MMT:    WNL  LUMBAR SPECIAL TESTS:  FABER test: Negative  FUNCTIONAL TESTS:  5 times sit to stand: 12s   : 700'   GAIT: Distance walked: In Clinic  Assistive device utilized: None  Level of assistance: Complete Independence Comments: Increased walking pace; no significant deficits noted   TREATMENT DATE:  10/04/24 Bike L 2.5 x Stability ball rollouts for low back  Cable pulldowns 2x10 20# Pallof Press 15# 2x10 Resisted gait lateral walking 10#  Trunk ext Btband  Seated row 25# 2x10 Hip 3 way 2.5# Steps ups 8 2x10 Glute bridges elevated on SB HS stretch Low back rotation stretch windshield wipers   09/03/24-Eval                                                                                                                                 PATIENT EDUCATION:  Education details: HEP Person educated: Patient Education method: Programmer, Multimedia, Facilities Manager, and Verbal cues Education comprehension: verbalized understanding and returned demonstration  HOME EXERCISE PROGRAM:  Access Code: [email or web address] Date: 09/03/2024  Exercises - Supine Single Knee to Chest Stretch  - 1 x daily - 7 x weekly - 2 sets -  2 reps - 20 hold - Supine Lower Trunk Rotation  - 1 x daily - 7 x weekly - 2 sets - 10 reps - Sit to Stand  - 1 x daily - 7 x weekly - 2 sets - 10 reps   ASSESSMENT:  CLINICAL IMPRESSION:  Pt exhibits good movement quality with functional mobility but exhibits a decrease in endurance with LBP and R hip pain. Pt began session with low back stretch to mitigate stiffness. Pt able to add light weight and resistance with core and back exercises. Pt will benefit from core strengthening and postural musculature strengthening. Plan to continue stretching for tight hamstrings as well.    Patient is a 68 y.o. feamle who was seen today for  physical therapy evaluation and treatment for LBP. Pt presents with low level LBP 1-2/10. Pt started experiencing pain 6 months ago and pain is more severe with prolonged walking. In session today; unable to recreate familiar LBP with palpation, ROM, or functional tests. Plan to continue assessing SIJ. Pt exhibits higher level capacity for activity and has remained active with walking and light exercise. Pt exhibits good quality of functional mobility and will benefit from stretching and dynamic exercises. Pt will benefit from PT to increase endurance without increase in pain and stretching for tight musculature surrounding the low back.   OBJECTIVE IMPAIRMENTS: decreased endurance and pain.   ACTIVITY LIMITATIONS: Walking >1 mile without  pain   PARTICIPATION LIMITATIONS: Longer walking around neighborhood   PERSONAL FACTORS: Time since onset of injury/illness/exacerbation are also affecting patient's functional outcome.   REHAB POTENTIAL: Good  CLINICAL DECISION MAKING: Stable/uncomplicated  EVALUATION COMPLEXITY: Low   GOALS: Goals reviewed with patient? Yes  SHORT TERM GOALS: Target date: 10/01/24  Patient will be independent with initial HEP.  Baseline:  Goal status: IN PROGRESS not doing consistently 10/04/24   LONG TERM GOALS: Target date: 11/08/24  Patient will be independent with advanced/ongoing HEP to improve outcomes and carryover.  Baseline:  Goal status: IN PROGRESS 10/04/24  2.   Patient will report 50-75% improvement in low back pain to improve QOL.  Baseline: 7/10 at worst  Goal status: IN PROGRESS same pain rating at worse 10/04/24  3. Patient will tolerate 2 miles of walking without increase in pain to return to performing her routine exercise program. Baseline:  Goal status: INITIAL  4.  Patient will be able to tolerate sitting for 45 minutes to an hour without an increase in LBP.  Baseline:  Goal status: INITIAL    PLAN:  PT FREQUENCY:  1x/week  PT DURATION: 8 weeks  PLANNED INTERVENTIONS: 97110-Therapeutic exercises, 97530- Therapeutic activity, [id number]- Neuromuscular re-education, 97535- Self Care, 02859- Manual therapy, G0283- Electrical stimulation (unattended), 02987- Traction (mechanical), and Patient/Family education.  PLAN FOR NEXT SESSION: Heat or STM if pain increased, assess SIJ, Stretching, strengthen hip and low back musculature    Thersia Alder, Student-PT 10/04/2024, 10:55 AM

## 2024-10-11 ENCOUNTER — Ambulatory Visit

## 2024-10-11 DIAGNOSIS — M25552 Pain in left hip: Secondary | ICD-10-CM

## 2024-10-11 DIAGNOSIS — M5459 Other low back pain: Secondary | ICD-10-CM

## 2024-10-11 DIAGNOSIS — M6289 Other specified disorders of muscle: Secondary | ICD-10-CM

## 2024-10-11 DIAGNOSIS — M62830 Muscle spasm of back: Secondary | ICD-10-CM

## 2024-10-11 NOTE — Therapy (Signed)
 " OUTPATIENT PHYSICAL THERAPY THORACOLUMBAR TREATMENT   Patient Name: Theresa Rice MRN: [id number] DOB:May 24, 1956, 68 y.o., female Today's Date: 10/11/2024  END OF SESSION:  PT End of Session - 10/11/24 1142     Visit Number 3    Date for Recertification  10/29/24    PT Start Time 1100    PT Stop Time 1142    PT Time Calculation (min) 42 min    Activity Tolerance Patient tolerated treatment well    Behavior During Therapy WFL for tasks assessed/performed         Past Medical History:  Diagnosis Date   Allergic rhinitis    Anxiety    Arthritis    Basal cell carcinoma    face   Endometriosis    Endometriosis    30 yrs ago   Heart palpitations    stress related   History of kidney stones    Hypercholesterolemia    Hypertension    Indigestion    occ   Left ureteral stone    Multinodular goiter    Plantar fasciitis    Thyroid  nodule    Bilateral   Trigger finger, left middle finger    Wears glasses    Past Surgical History:  Procedure Laterality Date   BREAST SURGERY     Right lumpectomy   CHOLECYSTECTOMY     COLONOSCOPY     CYSTOSCOPY/URETEROSCOPY/HOLMIUM LASER/STENT PLACEMENT Left 05/29/2018   Procedure: CYSTOSCOPY/RETROGRADE/URETEROSCOPY/, STONE BASKETRY, STENT PLACEMENT;  Surgeon: Ottelin, Mark, MD;  Location: [facility];  Service: Urology;  Laterality: Left;   DIAGNOSTIC LAPAROSCOPY     EYE SURGERY     URETEROSCOPY WITH HOLMIUM LASER LITHOTRIPSY Left 02/27/2018   Procedure: URETEROSCOPY WITH HOLMIUM LASER LITHOTRIPSY/ STENT PLACEMENT;  Surgeon: Ottelin, Mark, MD;  Location: [facility];  Service: Urology;  Laterality: Left;   There are no active problems to display for this patient.   PCP: Teresa Montie, MD  REFERRING PROVIDER: Beuford Anes, MD  REFERRING DIAG: Free text: LBP (M54.59)  Rationale for Evaluation and Treatment: Rehabilitation  THERAPY DIAG:  Other low back pain  Muscle tightness  Back muscle  spasm  Bilateral hip pain  ONSET DATE: 6-8 months ago   SUBJECTIVE:                                                                                                                                                                                           SUBJECTIVE STATEMENT: Pt states she has just minimal low back soreness today upon arrival of PT. Pt reported she had a back massage following last PT session and overall felt  okay today.   I have a history of bilateral hip pain that I went to PT for before; it did not seem to make a major difference. The lower back pain started about six to eight month ago when I started feeling pain in the low back into the left buttock with prolonged walking. The pain usually starts at about half a mile of walking. I got a massage yesterday that has calmed my pain. Right now pain level is a 1-2/10. The highest it ever gets is a 7/10.    PERTINENT HISTORY:  See above PMH chart   PAIN:  Are you having pain? Yes: NPRS scale: 1-2/10 Pain location: Lower back bilaterally L>R Pain description: ache  Aggravating factors: Prolonged sitting or standing  Relieving factors: Heat, rest  PRECAUTIONS: None  RED FLAGS: None   WEIGHT BEARING RESTRICTIONS: No  FALLS:  Has patient fallen in last 6 months? No  LIVING ENVIRONMENT: Lives with: lives with their spouse and lives with their daughter Lives in: House/apartment Stairs: No Has following equipment at home: None  OCCUPATION: Retired   PLOF: Independent  PATIENT GOALS: Walk pain free for two miles   NEXT MD VISIT: None at the moment   OBJECTIVE:  Note: Objective measures were completed at Evaluation unless otherwise noted.  DIAGNOSTIC FINDINGS:  Arthritis   COGNITION: Overall cognitive status: Within functional limits for tasks assessed     SENSATION: WFL   POSTURE: No Significant postural limitations  PALPATION: Tightness and slight pain in sacral spine  LUMBAR ROM:   WNL;  tightness in lateral flexion to the L side but no pain   LOWER EXTREMITY ROM:     WNL  LOWER EXTREMITY MMT:    WNL  LUMBAR SPECIAL TESTS:  FABER test: Negative  FUNCTIONAL TESTS:  5 times sit to stand: 12s   : 700'   GAIT: Distance walked: In Clinic  Assistive device utilized: None  Level of assistance: Complete Independence Comments: Increased walking pace; no significant deficits noted   TREATMENT DATE:   10/11/24 Bike L2.5 x 5 min  SB rollouts 1x12 Glute bridges with hip abd Rtband  Sideling hip abd 2x10 Pallof Press 2x10 10# Trunk ext Btband 2x10 Standing rows Btband 2x10 Lateral Step Ups 6 2x10 Bird Dogs 2x10 Seated hip abduction Btband 2x10 Calf stretch elevated board 2x:20s   10/04/24 Bike L 2.5 x Stability ball rollouts for low back  Cable pulldowns 2x10 20# Pallof Press 15# 2x10 Resisted gait lateral walking 10#  Trunk ext Btband  Seated row 25# 2x10 Hip 3 way 2.5# Steps ups 8 2x10 Glute bridges elevated on SB HS stretch Low back rotation stretch windshield wipers   09/03/24-Eval                                                                                                                                 PATIENT EDUCATION:  Education details: HEP Person educated: Patient  Education method: Explanation, Demonstration, and Verbal cues Education comprehension: verbalized understanding and returned demonstration  HOME EXERCISE PROGRAM:  Access Code: [email or web address] Date: 09/03/2024  Exercises - Supine Single Knee to Chest Stretch  - 1 x daily - 7 x weekly - 2 sets - 2 reps - 20 hold - Supine Lower Trunk Rotation  - 1 x daily - 7 x weekly - 2 sets - 10 reps - Sit to Stand  - 1 x daily - 7 x weekly - 2 sets - 10 reps   ASSESSMENT:  CLINICAL IMPRESSION:  Pt exhibits good movement quality with functional mobility but exhibits a decrease in endurance with LBP and bilateral hip pain. With SL stance activity pt requires rest periods as she  presents with decreased tolerance due to hip pain. Pt able to tolerate resistance training and light weight training well.   Patient is a 68 y.o. feamle who was seen today for physical therapy evaluation and treatment for LBP. Pt presents with low level LBP 1-2/10. Pt started experiencing pain 6 months ago and pain is more severe with prolonged walking. In session today; unable to recreate familiar LBP with palpation, ROM, or functional tests. Plan to continue assessing SIJ. Pt exhibits higher level capacity for activity and has remained active with walking and light exercise. Pt exhibits good quality of functional mobility and will benefit from stretching and dynamic exercises. Pt will benefit from PT to increase endurance without increase in pain and stretching for tight musculature surrounding the low back.   OBJECTIVE IMPAIRMENTS: decreased endurance and pain.   ACTIVITY LIMITATIONS: Walking >1 mile without  pain   PARTICIPATION LIMITATIONS: Longer walking around neighborhood   PERSONAL FACTORS: Time since onset of injury/illness/exacerbation are also affecting patient's functional outcome.   REHAB POTENTIAL: Good  CLINICAL DECISION MAKING: Stable/uncomplicated  EVALUATION COMPLEXITY: Low   GOALS: Goals reviewed with patient? Yes  SHORT TERM GOALS: Target date: 10/01/24  Patient will be independent with initial HEP.  Baseline:  Goal status: IN PROGRESS not doing consistently 10/04/24   LONG TERM GOALS: Target date: 11/08/24  Patient will be independent with advanced/ongoing HEP to improve outcomes and carryover.  Baseline:  Goal status: IN PROGRESS 10/04/24  2.   Patient will report 50-75% improvement in low back pain to improve QOL.  Baseline: 7/10 at worst  Goal status: IN PROGRESS 6/10 pain rating at worse 10/11/24  3. Patient will tolerate 2 miles of walking without increase in pain to return to performing her routine exercise program. Baseline:  Goal status: IN  PROGRESS 1/2 mile 10/11/24  4.  Patient will be able to tolerate sitting for 45 minutes to an hour without an increase in LBP.  Baseline:  Goal status: IN PROGRESS able to tolerate 30 min 10/11/24    PLAN:  PT FREQUENCY: 1x/week  PT DURATION: 8 weeks  PLANNED INTERVENTIONS: 97110-Therapeutic exercises, 97530- Therapeutic activity, 97112- Neuromuscular re-education, 97535- Self Care, 02859- Manual therapy, G0283- Electrical stimulation (unattended), 02987- Traction (mechanical), and Patient/Family education.  PLAN FOR NEXT SESSION: Heat or STM if pain increased, assess SIJ, Stretching, strengthen hip and low back musculature    Thersia Alder, Student-PT 10/11/2024, 11:43 AM  "

## 2024-10-25 ENCOUNTER — Ambulatory Visit: Attending: Orthopedic Surgery

## 2024-10-25 DIAGNOSIS — M5459 Other low back pain: Secondary | ICD-10-CM | POA: Insufficient documentation

## 2024-10-25 DIAGNOSIS — M25552 Pain in left hip: Secondary | ICD-10-CM | POA: Insufficient documentation

## 2024-10-25 DIAGNOSIS — M6289 Other specified disorders of muscle: Secondary | ICD-10-CM | POA: Diagnosis present

## 2024-10-25 DIAGNOSIS — M25551 Pain in right hip: Secondary | ICD-10-CM | POA: Diagnosis present

## 2024-10-25 DIAGNOSIS — M62830 Muscle spasm of back: Secondary | ICD-10-CM | POA: Diagnosis present

## 2024-10-25 NOTE — Therapy (Signed)
 " OUTPATIENT PHYSICAL THERAPY THORACOLUMBAR TREATMENT   Patient Name: Theresa Rice MRN: [id number] DOB:[birth year], 69 y.o., female Today's Date: 10/25/2024  END OF SESSION:  PT End of Session - 10/25/24 1132     Visit Number 4    Date for Recertification  10/29/24    PT Start Time 1100    PT Stop Time 1140    PT Time Calculation (min) 40 min    Activity Tolerance Patient tolerated treatment well    Behavior During Therapy WFL for tasks assessed/performed         Past Medical History:  Diagnosis Date   Allergic rhinitis    Anxiety    Arthritis    Basal cell carcinoma    face   Endometriosis    Endometriosis    30 yrs ago   Heart palpitations    stress related   History of kidney stones    Hypercholesterolemia    Hypertension    Indigestion    occ   Left ureteral stone    Multinodular goiter    Plantar fasciitis    Thyroid  nodule    Bilateral   Trigger finger, left middle finger    Wears glasses    Past Surgical History:  Procedure Laterality Date   BREAST SURGERY     Right lumpectomy   CHOLECYSTECTOMY     COLONOSCOPY     CYSTOSCOPY/URETEROSCOPY/HOLMIUM LASER/STENT PLACEMENT Left 05/29/2018   Procedure: CYSTOSCOPY/RETROGRADE/URETEROSCOPY/, STONE BASKETRY, STENT PLACEMENT;  Surgeon: Ottelin, Mark, MD;  Location: [facility] [place];  Service: Urology;  Laterality: Left;   DIAGNOSTIC LAPAROSCOPY     EYE SURGERY     URETEROSCOPY WITH HOLMIUM LASER LITHOTRIPSY Left 02/27/2018   Procedure: URETEROSCOPY WITH HOLMIUM LASER LITHOTRIPSY/ STENT PLACEMENT;  Surgeon: Ottelin, Mark, MD;  Location: [facility] [place];  Service: Urology;  Laterality: Left;   There are no active problems to display for this patient.   PCP: Teresa Montie, MD  REFERRING PROVIDER: Beuford Anes, MD  REFERRING DIAG: Free text: LBP (M54.59)  Rationale for Evaluation and Treatment: Rehabilitation  THERAPY DIAG:  Bilateral hip pain  Other low back pain  Muscle  tightness  Back muscle spasm  ONSET DATE: 6-8 months ago   SUBJECTIVE:                                                                                                                                                                                           SUBJECTIVE STATEMENT:  Pt reports general stiffness in the low back and R/L hip.   I have a history of bilateral hip pain that I went to PT for  before; it did not seem to make a major difference. The lower back pain started about six to eight month ago when I started feeling pain in the low back into the left buttock with prolonged walking. The pain usually starts at about half a mile of walking. I got a massage yesterday that has calmed my pain. Right now pain level is a 1-2/10. The highest it ever gets is a 7/10.    PERTINENT HISTORY:  See above PMH chart   PAIN:  Are you having pain? Yes: NPRS scale: 1-2/10 Pain location: Lower back bilaterally L>R Pain description: ache  Aggravating factors: Prolonged sitting or standing  Relieving factors: Heat, rest  PRECAUTIONS: None  RED FLAGS: None   WEIGHT BEARING RESTRICTIONS: No  FALLS:  Has patient fallen in last 6 months? No  LIVING ENVIRONMENT: Lives with: lives with their spouse and lives with their daughter Lives in: House/apartment Stairs: No Has following equipment at home: None  OCCUPATION: Retired   PLOF: Independent  PATIENT GOALS: Walk pain free for two miles   NEXT MD VISIT: None at the moment   OBJECTIVE:  Note: Objective measures were completed at Evaluation unless otherwise noted.  DIAGNOSTIC FINDINGS:  Arthritis   COGNITION: Overall cognitive status: Within functional limits for tasks assessed     SENSATION: WFL   POSTURE: No Significant postural limitations  PALPATION: Tightness and slight pain in sacral spine  LUMBAR ROM:   WNL; tightness in lateral flexion to the L side but no pain   LOWER EXTREMITY ROM:     WNL  LOWER  EXTREMITY MMT:    WNL  LUMBAR SPECIAL TESTS:  FABER test: Negative  FUNCTIONAL TESTS:  5 times sit to stand: 12s   : 700'   GAIT: Distance walked: In Clinic  Assistive device utilized: None  Level of assistance: Complete Independence Comments: Increased walking pace; no significant deficits noted   TREATMENT DATE:  10/25/24 Bike L2.5 x 6 min Pallof Press 10# 2x10  Cable Pulldowns 2x10 20# SLR 1.5# 2x10 Dead Bugs just legs 2x10 Trunk ext Btband 2x10 Standing hip abduction 1.5# 2x10 Step Ups 6 2x10 Lateral Step Ups 6 2x10 Knee to chest stretch/ lumbar rotation stretch  10/11/24 Bike L2.5 x 5 min  SB rollouts 1x12 Glute bridges with hip abd Rtband  Sideling hip abd 2x10 Pallof Press 2x10 10# Trunk ext Btband 2x10 Standing rows Btband 2x10 Lateral Step Ups 6 2x10 Bird Dogs 2x10 Seated hip abduction Btband 2x10 Calf stretch elevated board 2x:20s   10/04/24 Bike L 2.5 x Stability ball rollouts for low back  Cable pulldowns 2x10 20# Pallof Press 15# 2x10 Resisted gait lateral walking 10#  Trunk ext Btband  Seated row 25# 2x10 Hip 3 way 2.5# Steps ups 8 2x10 Glute bridges elevated on SB HS stretch Low back rotation stretch windshield wipers   09/03/24-Eval  PATIENT EDUCATION:  Education details: HEP Person educated: Patient Education method: Solicitor, and Verbal cues Education comprehension: verbalized understanding and returned demonstration  HOME EXERCISE PROGRAM:  Access Code: [email or web address] Date: 09/03/2024  Exercises - Supine Single Knee to Chest Stretch  - 1 x daily - 7 x weekly - 2 sets - 2 reps - 20 hold - Supine Lower Trunk Rotation  - 1 x daily - 7 x weekly - 2 sets - 10 reps - Sit to Stand  - 1 x daily - 7 x weekly - 2 sets - 10 reps   ASSESSMENT:  CLINICAL IMPRESSION:  Focus on session  today pertained to core strengthening and focus on lumbo-pelvic activity in order to decrease joint pain and increase strength. Pt exhibits good movement quality with functional mobility but exhibits a decrease in endurance with LBP and bilateral hip pain. With SL stance activity pt requires rest periods as she presents with decreased tolerance due to hip pain. Pt able to tolerate resistance training and light weight training well. Pt will benefit from stretching and general mobility in order to decrease joint stiffness.    Patient is a 69 y.o. feamle who was seen today for physical therapy evaluation and treatment for LBP. Pt presents with low level LBP 1-2/10. Pt started experiencing pain 6 months ago and pain is more severe with prolonged walking. In session today; unable to recreate familiar LBP with palpation, ROM, or functional tests. Plan to continue assessing SIJ. Pt exhibits higher level capacity for activity and has remained active with walking and light exercise. Pt exhibits good quality of functional mobility and will benefit from stretching and dynamic exercises. Pt will benefit from PT to increase endurance without increase in pain and stretching for tight musculature surrounding the low back.   OBJECTIVE IMPAIRMENTS: decreased endurance and pain.   ACTIVITY LIMITATIONS: Walking >1 mile without  pain   PARTICIPATION LIMITATIONS: Longer walking around neighborhood   PERSONAL FACTORS: Time since onset of injury/illness/exacerbation are also affecting patient's functional outcome.   REHAB POTENTIAL: Good  CLINICAL DECISION MAKING: Stable/uncomplicated  EVALUATION COMPLEXITY: Low   GOALS: Goals reviewed with patient? Yes  SHORT TERM GOALS: Target date: 10/01/24  Patient will be independent with initial HEP.  Baseline:  Goal status: IN PROGRESS not doing consistently 10/25/24   LONG TERM GOALS: Target date: 11/08/24  Patient will be independent with advanced/ongoing HEP to  improve outcomes and carryover.  Baseline:  Goal status: IN PROGRESS print out of HEP given today 10/25/24  2.   Patient will report 50-75% improvement in low back pain to improve QOL.  Baseline: 7/10 at worst  Goal status: IN PROGRESS 6/10 pain rating at worse 10/11/24; Unchanged 10/25/24  3. Patient will tolerate 2 miles of walking without increase in pain to return to performing her routine exercise program. Baseline:  Goal status: IN PROGRESS 1/2 mile 10/11/24; Unchanged 10/25/24  4.  Patient will be able to tolerate sitting for 45 minutes to an hour without an increase in LBP.  Baseline:  Goal status: IN PROGRESS able to tolerate 30 min 10/11/24; MET 10/25/24    PLAN:  PT FREQUENCY: 1x/week  PT DURATION: 8 weeks  PLANNED INTERVENTIONS: 97110-Therapeutic exercises, 97530- Therapeutic activity, 97112- Neuromuscular re-education, 97535- Self Care, 02859- Manual therapy, G0283- Electrical stimulation (unattended), 02987- Traction (mechanical), and Patient/Family education.  PLAN FOR NEXT SESSION: Heat or STM if pain increased, assess SIJ, Stretching, strengthen hip and low back musculature    Thersia Alder, Student-PT 10/25/2024,  11:44 AM  "

## 2024-11-01 ENCOUNTER — Ambulatory Visit

## 2024-11-01 DIAGNOSIS — M5459 Other low back pain: Secondary | ICD-10-CM

## 2024-11-01 DIAGNOSIS — M25551 Pain in right hip: Secondary | ICD-10-CM

## 2024-11-01 DIAGNOSIS — M6289 Other specified disorders of muscle: Secondary | ICD-10-CM

## 2024-11-01 DIAGNOSIS — M62830 Muscle spasm of back: Secondary | ICD-10-CM

## 2024-11-01 NOTE — Therapy (Signed)
 " OUTPATIENT PHYSICAL THERAPY THORACOLUMBAR TREATMENT   Patient Name: Theresa Rice MRN: [id number] DOB:1956/01/30, 69 y.o., female Today's Date: 11/01/2024  END OF SESSION:  PT End of Session - 11/01/24 1059     Visit Number 5    Date for Recertification  11/08/24    Authorization Type --    PT Start Time 1100    PT Stop Time 1142    PT Time Calculation (min) 42 min    Activity Tolerance Patient tolerated treatment well    Behavior During Therapy WFL for tasks assessed/performed          Past Medical History:  Diagnosis Date   Allergic rhinitis    Anxiety    Arthritis    Basal cell carcinoma    face   Endometriosis    Endometriosis    30 yrs ago   Heart palpitations    stress related   History of kidney stones    Hypercholesterolemia    Hypertension    Indigestion    occ   Left ureteral stone    Multinodular goiter    Plantar fasciitis    Thyroid  nodule    Bilateral   Trigger finger, left middle finger    Wears glasses    Past Surgical History:  Procedure Laterality Date   BREAST SURGERY     Right lumpectomy   CHOLECYSTECTOMY     COLONOSCOPY     CYSTOSCOPY/URETEROSCOPY/HOLMIUM LASER/STENT PLACEMENT Left 05/29/2018   Procedure: CYSTOSCOPY/RETROGRADE/URETEROSCOPY/, STONE BASKETRY, STENT PLACEMENT;  Surgeon: Ottelin, Mark, MD;  Location: [facility] [place];  Service: Urology;  Laterality: Left;   DIAGNOSTIC LAPAROSCOPY     EYE SURGERY     URETEROSCOPY WITH HOLMIUM LASER LITHOTRIPSY Left 02/27/2018   Procedure: URETEROSCOPY WITH HOLMIUM LASER LITHOTRIPSY/ STENT PLACEMENT;  Surgeon: Ottelin, Mark, MD;  Location: [facility] [place];  Service: Urology;  Laterality: Left;   There are no active problems to display for this patient.   PCP: Teresa Montie, MD  REFERRING PROVIDER: Beuford Anes, MD  REFERRING DIAG: Free text: LBP (M54.59)  Rationale for Evaluation and Treatment: Rehabilitation  THERAPY DIAG:  Bilateral hip pain  Back  muscle spasm  Other low back pain  Muscle tightness  ONSET DATE: 6-8 months ago   SUBJECTIVE:                                                                                                                                                                                           SUBJECTIVE STATEMENT: Pt reports not having much of any pain today upon arrival of PT. Pt does report the hip pain does make  it hard to sleep at night with trouble finding comfortable positions.   I have a history of bilateral hip pain that I went to PT for before; it did not seem to make a major difference. The lower back pain started about six to eight month ago when I started feeling pain in the low back into the left buttock with prolonged walking. The pain usually starts at about half a mile of walking. I got a massage yesterday that has calmed my pain. Right now pain level is a 1-2/10. The highest it ever gets is a 7/10.    PERTINENT HISTORY:  See above PMH chart   PAIN:  Are you having pain? Yes: NPRS scale: 1-2/10 Pain location: Lower back bilaterally L>R Pain description: ache  Aggravating factors: Prolonged sitting or standing  Relieving factors: Heat, rest  PRECAUTIONS: None  RED FLAGS: None   WEIGHT BEARING RESTRICTIONS: No  FALLS:  Has patient fallen in last 6 months? No  LIVING ENVIRONMENT: Lives with: lives with their spouse and lives with their daughter Lives in: House/apartment Stairs: No Has following equipment at home: None  OCCUPATION: Retired   PLOF: Independent  PATIENT GOALS: Walk pain free for two miles   NEXT MD VISIT: None at the moment   OBJECTIVE:  Note: Objective measures were completed at Evaluation unless otherwise noted.  DIAGNOSTIC FINDINGS:  Arthritis   COGNITION: Overall cognitive status: Within functional limits for tasks assessed     SENSATION: WFL   POSTURE: No Significant postural limitations  PALPATION: Tightness and slight pain in sacral  spine  LUMBAR ROM:   WNL; tightness in lateral flexion to the L side but no pain   LOWER EXTREMITY ROM:     WNL  LOWER EXTREMITY MMT:    WNL  LUMBAR SPECIAL TESTS:  FABER test: Negative  FUNCTIONAL TESTS:  5 times sit to stand: 12s   : 700'   GAIT: Distance walked: In Clinic  Assistive device utilized: None  Level of assistance: Complete Independence Comments: Increased walking pace; no significant deficits noted   TREATMENT DATE:  11/01/24 Resisted Lateral walking 20#  Cable machine Core rotations 2x10 Step Ups 8 1x10 Lateral Step Ups 8 1x10  STS w/ Chest Press yellow weighted ball Elevated Glute bridge + HS curl Supine on SB 2x10 HS Stretch Calf Stretch Bike l3.5 x 6 min  10/25/24 Bike L2.5 x 6 min Pallof Press 10# 2x10  Cable Pulldowns 2x10 20# SLR 1.5# 2x10 Dead Bugs just legs 2x10 Trunk ext Btband 2x10 Standing hip abduction 1.5# 2x10 Step Ups 6 2x10 Lateral Step Ups 6 2x10 Knee to chest stretch/ lumbar rotation stretch  10/11/24 Bike L2.5 x 5 min  SB rollouts 1x12 Glute bridges with hip abd Rtband  Sideling hip abd 2x10 Pallof Press 2x10 10# Trunk ext Btband 2x10 Standing rows Btband 2x10 Lateral Step Ups 6 2x10 Bird Dogs 2x10 Seated hip abduction Btband 2x10 Calf stretch elevated board 2x:20s   10/04/24 Bike L 2.5 x Stability ball rollouts for low back  Cable pulldowns 2x10 20# Pallof Press 15# 2x10 Resisted gait lateral walking 10#  Trunk ext Btband  Seated row 25# 2x10 Hip 3 way 2.5# Steps ups 8 2x10 Glute bridges elevated on SB HS stretch Low back rotation stretch windshield wipers   09/03/24-Eval  PATIENT EDUCATION:  Education details: HEP Person educated: Patient Education method: Solicitor, and Verbal cues Education comprehension: verbalized understanding and  returned demonstration  HOME EXERCISE PROGRAM:  Access Code: [email or web address] Date: 09/03/2024  Exercises - Supine Single Knee to Chest Stretch  - 1 x daily - 7 x weekly - 2 sets - 2 reps - 20 hold - Supine Lower Trunk Rotation  - 1 x daily - 7 x weekly - 2 sets - 10 reps - Sit to Stand  - 1 x daily - 7 x weekly - 2 sets - 10 reps   ASSESSMENT:  CLINICAL IMPRESSION:  Pt still deals with bilateral hip pain > back pain but low back still present at times. Focus on session today, again, pertained to core strengthening and focus on lumbo-pelvic activity in order to decrease joint pain and increase strength. Pt with ability to engage in weighted exercise and resistance activities without an increase in pain. Pt will benefit from stretching and general mobility in order to decrease joint stiffness; along with LE and core strengthening.    Patient is a 69 y.o. feamle who was seen today for physical therapy evaluation and treatment for LBP. Pt presents with low level LBP 1-2/10. Pt started experiencing pain 6 months ago and pain is more severe with prolonged walking. In session today; unable to recreate familiar LBP with palpation, ROM, or functional tests. Plan to continue assessing SIJ. Pt exhibits higher level capacity for activity and has remained active with walking and light exercise. Pt exhibits good quality of functional mobility and will benefit from stretching and dynamic exercises. Pt will benefit from PT to increase endurance without increase in pain and stretching for tight musculature surrounding the low back.   OBJECTIVE IMPAIRMENTS: decreased endurance and pain.   ACTIVITY LIMITATIONS: Walking >1 mile without  pain   PARTICIPATION LIMITATIONS: Longer walking around neighborhood   PERSONAL FACTORS: Time since onset of injury/illness/exacerbation are also affecting patient's functional outcome.   REHAB POTENTIAL: Good  CLINICAL DECISION MAKING: Stable/uncomplicated  EVALUATION COMPLEXITY:  Low   GOALS: Goals reviewed with patient? Yes  SHORT TERM GOALS: Target date: 10/01/24  Patient will be independent with initial HEP.  Baseline:  Goal status: IN PROGRESS been more consistent since last visit 11/01/24   LONG TERM GOALS: Target date: 11/08/24  Patient will be independent with advanced/ongoing HEP to improve outcomes and carryover.  Baseline:  Goal status: IN PROGRESS print out of HEP given today 10/25/24; IN PROGRESS 11/01/24  2.   Patient will report 50-75% improvement in low back pain to improve QOL.  Baseline: 7/10 at worst  Goal status: IN PROGRESS 6/10 pain rating at worse 10/11/24; Unchanged 10/25/24  3. Patient will tolerate 2 miles of walking without increase in pain to return to performing her routine exercise program. Baseline:  Goal status: IN PROGRESS 1/2 mile 10/11/24; Unchanged 11/01/24  4.  Patient will be able to tolerate sitting for 45 minutes to an hour without an increase in LBP.  Baseline:  Goal status: IN PROGRESS able to tolerate 30 min 10/11/24; MET 10/25/24    PLAN:  PT FREQUENCY: 1x/week  PT DURATION: 8 weeks  PLANNED INTERVENTIONS: 97110-Therapeutic exercises, 97530- Therapeutic activity, 97112- Neuromuscular re-education, 97535- Self Care, 02859- Manual therapy, G0283- Electrical stimulation (unattended), 02987- Traction (mechanical), and Patient/Family education.  PLAN FOR NEXT SESSION: Heat or STM if pain increased, assess SIJ, Stretching, strengthen hip and low back musculature    Thersia Alder, Student-PT 11/01/2024, 11:42 AM  "

## 2024-11-08 ENCOUNTER — Ambulatory Visit

## 2024-11-08 DIAGNOSIS — M25551 Pain in right hip: Secondary | ICD-10-CM

## 2024-11-08 DIAGNOSIS — M5459 Other low back pain: Secondary | ICD-10-CM

## 2024-11-08 DIAGNOSIS — M6289 Other specified disorders of muscle: Secondary | ICD-10-CM

## 2024-11-08 DIAGNOSIS — M62830 Muscle spasm of back: Secondary | ICD-10-CM

## 2024-11-08 NOTE — Therapy (Signed)
 " OUTPATIENT PHYSICAL THERAPY THORACOLUMBAR TREATMENT   Patient Name: Theresa Rice MRN: [id number] DOB:1956/01/08, 69 y.o., female Today's Date: 11/08/2024  END OF SESSION:  PT End of Session - 11/08/24 1101     Visit Number 6    Date for Recertification  12/13/24    PT Start Time 1100    PT Stop Time 1145    PT Time Calculation (min) 45 min    Activity Tolerance Patient tolerated treatment well    Behavior During Therapy WFL for tasks assessed/performed           Past Medical History:  Diagnosis Date   Allergic rhinitis    Anxiety    Arthritis    Basal cell carcinoma    face   Endometriosis    Endometriosis    30 yrs ago   Heart palpitations    stress related   History of kidney stones    Hypercholesterolemia    Hypertension    Indigestion    occ   Left ureteral stone    Multinodular goiter    Plantar fasciitis    Thyroid  nodule    Bilateral   Trigger finger, left middle finger    Wears glasses    Past Surgical History:  Procedure Laterality Date   BREAST SURGERY     Right lumpectomy   CHOLECYSTECTOMY     COLONOSCOPY     CYSTOSCOPY/URETEROSCOPY/HOLMIUM LASER/STENT PLACEMENT Left 05/29/2018   Procedure: CYSTOSCOPY/RETROGRADE/URETEROSCOPY/, STONE BASKETRY, STENT PLACEMENT;  Surgeon: Ottelin, Mark, MD;  Location: [facility];  Service: Urology;  Laterality: Left;   DIAGNOSTIC LAPAROSCOPY     EYE SURGERY     URETEROSCOPY WITH HOLMIUM LASER LITHOTRIPSY Left 02/27/2018   Procedure: URETEROSCOPY WITH HOLMIUM LASER LITHOTRIPSY/ STENT PLACEMENT;  Surgeon: Ottelin, Mark, MD;  Location: [facility];  Service: Urology;  Laterality: Left;   There are no active problems to display for this patient.   PCP: Teresa Montie, MD  REFERRING PROVIDER: Beuford Anes, MD  REFERRING DIAG: Free text: LBP (M54.59)  Rationale for Evaluation and Treatment: Rehabilitation  THERAPY DIAG:  Bilateral hip pain  Back muscle spasm  Other low  back pain  Muscle tightness  ONSET DATE: 6-8 months ago   SUBJECTIVE:                                                                                                                                                                                           SUBJECTIVE STATEMENT: Doing good. No pain right now. Mainly just feeling it with walking and standing too long. I have been walking more and it does feel a  little better.   I have a history of bilateral hip pain that I went to PT for before; it did not seem to make a major difference. The lower back pain started about six to eight month ago when I started feeling pain in the low back into the left buttock with prolonged walking. The pain usually starts at about half a mile of walking. I got a massage yesterday that has calmed my pain. Right now pain level is a 1-2/10. The highest it ever gets is a 7/10.    PERTINENT HISTORY:  See above PMH chart   PAIN:  Are you having pain? Yes: NPRS scale: 1-2/10 Pain location: Lower back bilaterally L>R Pain description: ache  Aggravating factors: Prolonged sitting or standing  Relieving factors: Heat, rest  PRECAUTIONS: None  RED FLAGS: None   WEIGHT BEARING RESTRICTIONS: No  FALLS:  Has patient fallen in last 6 months? No  LIVING ENVIRONMENT: Lives with: lives with their spouse and lives with their daughter Lives in: House/apartment Stairs: No Has following equipment at home: None  OCCUPATION: Retired   PLOF: Independent  PATIENT GOALS: Walk pain free for two miles   NEXT MD VISIT: None at the moment   OBJECTIVE:  Note: Objective measures were completed at Evaluation unless otherwise noted.  DIAGNOSTIC FINDINGS:  Arthritis   COGNITION: Overall cognitive status: Within functional limits for tasks assessed     SENSATION: WFL   POSTURE: No Significant postural limitations  PALPATION: Tightness and slight pain in sacral spine  LUMBAR ROM:   WNL; tightness in  lateral flexion to the L side but no pain   LOWER EXTREMITY ROM:     WNL  LOWER EXTREMITY MMT:    WNL  LUMBAR SPECIAL TESTS:  FABER test: Negative  FUNCTIONAL TESTS:  5 times sit to stand: 12s   : 700'   GAIT: Distance walked: In Clinic  Assistive device utilized: None  Level of assistance: Complete Independence Comments: Increased walking pace; no significant deficits noted   TREATMENT DATE:  11/08/24 Bike L3 x23mins  2 way hip 5# 2x10 Step ups forward and lateral 6 Feet on ball rotations, knees to chest, small bridges  Deadbug 2x10 alternating  AB roll ups 2x10 Bridge with ball squeeze 2x10 Resisted trunk rotations 2x10 Figure 4 stretch 30s each side  Lat pull down 25# 2x10 Seated rows 25# 2x10 Deadlift with 5# KB to 6 step  11/01/24 Resisted Lateral walking 20#  Cable machine Core rotations 2x10 Step Ups 8 1x10 Lateral Step Ups 8 1x10  STS w/ Chest Press yellow weighted ball Elevated Glute bridge + HS curl Supine on SB 2x10 HS Stretch Calf Stretch Bike l3.5 x 6 min  10/25/24 Bike L2.5 x 6 min Pallof Press 10# 2x10  Cable Pulldowns 2x10 20# SLR 1.5# 2x10 Dead Bugs just legs 2x10 Trunk ext Btband 2x10 Standing hip abduction 1.5# 2x10 Step Ups 6 2x10 Lateral Step Ups 6 2x10 Knee to chest stretch/ lumbar rotation stretch  10/11/24 Bike L2.5 x 5 min  SB rollouts 1x12 Glute bridges with hip abd Rtband  Sideling hip abd 2x10 Pallof Press 2x10 10# Trunk ext Btband 2x10 Standing rows Btband 2x10 Lateral Step Ups 6 2x10 Bird Dogs 2x10 Seated hip abduction Btband 2x10 Calf stretch elevated board 2x:20s   10/04/24 Bike L 2.5 x Stability ball rollouts for low back  Cable pulldowns 2x10 20# Pallof Press 15# 2x10 Resisted gait lateral walking 10#  Trunk ext Btband  Seated row  25# 2x10 Hip 3 way 2.5# Steps ups 8 2x10 Glute bridges elevated on SB HS stretch Low back rotation stretch windshield wipers   09/03/24-Eval                                                                                                                                  PATIENT EDUCATION:  Education details: HEP Person educated: Patient Education method: Programmer, Multimedia, Facilities Manager, and Verbal cues Education comprehension: verbalized understanding and returned demonstration  HOME EXERCISE PROGRAM:  Access Code: [email or web address] Date: 09/03/2024  Exercises - Supine Single Knee to Chest Stretch  - 1 x daily - 7 x weekly - 2 sets - 2 reps - 20 hold - Supine Lower Trunk Rotation  - 1 x daily - 7 x weekly - 2 sets - 10 reps - Sit to Stand  - 1 x daily - 7 x weekly - 2 sets - 10 reps   ASSESSMENT:  CLINICAL IMPRESSION: Patient reports no pain today, she has been walking more and feels that things have gotten some better. Session today continued to focus on hip and core strengthening to improve activity tolerance. Some muscle burning with 2 way hips. Ended session with some back strengthening on machines. No complaints of pain in session. Pt will benefit from stretching and general mobility in order to decrease joint stiffness; along with LE and core strengthening. Recerted for another month to get a few more visits in before transitioning to HEP.    Patient is a 70 y.o. feamle who was seen today for physical therapy evaluation and treatment for LBP. Pt presents with low level LBP 1-2/10. Pt started experiencing pain 6 months ago and pain is more severe with prolonged walking. In session today; unable to recreate familiar LBP with palpation, ROM, or functional tests. Plan to continue assessing SIJ. Pt exhibits higher level capacity for activity and has remained active with walking and light exercise. Pt exhibits good quality of functional mobility and will benefit from stretching and dynamic exercises. Pt will benefit from PT to increase endurance without increase in pain and stretching for tight musculature surrounding the low back.   OBJECTIVE IMPAIRMENTS:  decreased endurance and pain.   ACTIVITY LIMITATIONS: Walking >1 mile without  pain   PARTICIPATION LIMITATIONS: Longer walking around neighborhood   PERSONAL FACTORS: Time since onset of injury/illness/exacerbation are also affecting patient's functional outcome.   REHAB POTENTIAL: Good  CLINICAL DECISION MAKING: Stable/uncomplicated  EVALUATION COMPLEXITY: Low   GOALS: Goals reviewed with patient? Yes  SHORT TERM GOALS: Target date: 10/01/24  Patient will be independent with initial HEP.  Baseline:  Goal status: IN PROGRESS been more consistent since last visit 11/01/24   LONG TERM GOALS: Target date: 12/13/24  Patient will be independent with advanced/ongoing HEP to improve outcomes and carryover.  Baseline:  Goal status: IN PROGRESS print out of HEP given today 10/25/24; IN PROGRESS 11/01/24  2.   Patient will report  50-75% improvement in low back pain to improve QOL.  Baseline: 7/10 at worst  Goal status: IN PROGRESS 6/10 pain rating at worse 10/11/24; Unchanged 10/25/24  3. Patient will tolerate 2 miles of walking without increase in pain to return to performing her routine exercise program. Baseline:  Goal status: IN PROGRESS 1/2 mile 10/11/24; Unchanged 11/01/24  4.  Patient will be able to tolerate sitting for 45 minutes to an hour without an increase in LBP.  Baseline:  Goal status: IN PROGRESS able to tolerate 30 min 10/11/24; MET 10/25/24    PLAN:  PT FREQUENCY: 1x/week  PT DURATION: 8 weeks  PLANNED INTERVENTIONS: 97110-Therapeutic exercises, 97530- Therapeutic activity, 97112- Neuromuscular re-education, 97535- Self Care, 02859- Manual therapy, [id number]- Electrical stimulation (unattended), 02987- Traction (mechanical), and Patient/Family education.  PLAN FOR NEXT SESSION: Heat or STM if pain increased, assess SIJ, Stretching, strengthen hip and low back musculature    Almetta Fam, PT 11/08/2024, 11:40 AM  "

## 2024-11-15 ENCOUNTER — Ambulatory Visit
